# Patient Record
Sex: Female | Born: 1975 | Race: Black or African American | Hispanic: No | Marital: Single | Smoking: Former smoker
Health system: Southern US, Community
[De-identification: ages and names within clinical notes are randomized; demographics above are authoritative.]

## PROBLEM LIST (undated history)

## (undated) DIAGNOSIS — K047 Periapical abscess without sinus: Secondary | ICD-10-CM

## (undated) DIAGNOSIS — Z973 Presence of spectacles and contact lenses: Secondary | ICD-10-CM

## (undated) DIAGNOSIS — D171 Benign lipomatous neoplasm of skin and subcutaneous tissue of trunk: Secondary | ICD-10-CM

## (undated) DIAGNOSIS — D649 Anemia, unspecified: Secondary | ICD-10-CM

---

## 2011-12-22 ENCOUNTER — Emergency Department (HOSPITAL_COMMUNITY)
Admission: EM | Admit: 2011-12-22 | Discharge: 2011-12-22 | Disposition: A | Discharge status: Home / Self Care | Payer: Self-pay | Attending: Emergency Medicine | Admitting: Emergency Medicine

## 2011-12-22 ENCOUNTER — Emergency Department (HOSPITAL_COMMUNITY): Payer: Self-pay

## 2011-12-22 ENCOUNTER — Encounter (HOSPITAL_COMMUNITY): Payer: Self-pay | Admitting: *Deleted

## 2011-12-22 DIAGNOSIS — D179 Benign lipomatous neoplasm, unspecified: Secondary | ICD-10-CM | POA: Insufficient documentation

## 2011-12-22 NOTE — ED Provider Notes (Addendum)
History   Scribed for [employer]. Colon Branch, MD, the patient was seen in APA08/AP[facility]08. The chart was scribed by Gilman Schmidt. The patients care was started at 4:15 PM.   CSN: [id number]  Arrival date & time 12/22/11  1552   First MD Initiated Contact with Patient 12/22/11 1612      Chief Complaint  Patient presents with  . lump to left upper chest     (Consider location/radiation/quality/duration/timing/severity/associated sxs/prior treatment) HPI Shelly Ramirez is a 36 y.o. female who presents to the Emergency Department complaining of lump to left upper chest onset ~six months. Denies any pain. There are no other associated symptoms and no other alleviating or aggravating factors.   PCP: none    History reviewed. No pertinent past medical history.  Past Surgical History  Procedure Date  . Cesarean section     No family history on file.  History  Substance Use Topics  . Smoking status: Never Smoker   . Smokeless tobacco: Not on file  . Alcohol Use: Yes     Occ    OB History    Grav Para Term Preterm Abortions TAB SAB Ect Mult Living                  Review of Systems  Skin:       Lump to left upper chest   All other systems reviewed and are negative.    Allergies  Review of patient's allergies indicates no known allergies.  Home Medications  No current outpatient prescriptions on file.  BP 131/83  Pulse 82  Temp(Src) 97.9 F (36.6 C) (Oral)  Resp 20  Ht 5\' 9"  (1.753 m)  Wt 292 lb 5 oz (132.592 kg)  BMI 43.17 kg/m2  SpO2 100%  LMP 12/11/2011  Physical Exam  Nursing note and vitals reviewed. Constitutional:       Awake, alert, nontoxic appearance.  HENT:  Head: Atraumatic.  Eyes: Right eye exhibits no discharge. Left eye exhibits no discharge.  Neck: Neck supple.  Pulmonary/Chest: Effort normal. She exhibits no tenderness.       Discrete 2x5 cm soft tissue mass to left upper chest  Freely moveable  Non tender No warmth No legions  Abdominal:  Soft. There is no tenderness. There is no rebound.  Musculoskeletal: She exhibits no tenderness.       Baseline ROM, no obvious new focal weakness.  Neurological:       Mental status and motor strength appears baseline for patient and situation.  Skin: No rash noted.  Psychiatric: She has a normal mood and affect.    ED Course  Procedures (including critical care time)  Labs Reviewed - No data to display No results found.   No diagnosis found.  DIAGNOSTIC STUDIES: Oxygen Saturation is 100% on room air, normal by my interpretation.    COORDINATION OF CARE: 4:15p:  - Patient evaluated by ED physician, DG Chest ordered      MDM  Patient presents with a soft tissue mass to the left upper chest. It has been present for some time. Chest x-ray cannot appreciate the area. Further evaluation could be with either CT or MRI, neither of which are indicated at the present time. Dx testing d/w pt. Questions answered.  Verb understanding, agreeable to d/c home with outpt f/u.Pt stable in ED with no significant deterioration in condition.The patient appears reasonably screened and/or stabilized for discharge and I doubt any other medical condition or other [facility] requiring further screening, evaluation, or  treatment in the ED at this time prior to discharge.  I personally performed the services described in this documentation, which was scribed in my presence. The recorded information has been reviewed and considered.   MDM Reviewed: nursing note and vitals Interpretation: x-ray           Nicoletta Dress. Colon Branch, MD 12/22/11 1708  Nicoletta Dress. Colon Branch, MD 12/22/11 1712

## 2011-12-22 NOTE — Discharge Instructions (Signed)
This area is a bit of fatty tissue. You may continue surveillance. If it gets larger, painful, has any drainage, you should have it evaluated. Lipoma A lipoma is a noncancerous (benign) tumor composed of fat cells. They are usually found under the skin (subcutaneous). A lipoma may occur in any tissue of the body that contains fat. Common areas for lipomas to appear include the back, shoulders, buttocks, and thighs. Lipomas are a very common soft tissue growth. They are soft and grow slowly. Most problems caused by a lipoma depend on where it is growing. DIAGNOSIS  A lipoma can be diagnosed with a physical exam. These tumors rarely become cancerous, but radiographic studies can help determine this for certain. Studies used may include:  Computerized X-ray scans (CT or CAT scan).   Computerized magnetic scans (MRI).  TREATMENT  Small lipomas that are not causing problems may be watched. If a lipoma continues to enlarge or causes problems, removal is often the best treatment. Lipomas can also be removed to improve appearance. Surgery is done to remove the fatty cells and the surrounding capsule. Most often, this is done with medicine that numbs the area (local anesthetic). The removed tissue is examined under a microscope to make sure it is not cancerous. Keep all follow-up appointments with your caregiver. SEEK MEDICAL CARE IF:   The lipoma becomes larger or hard.   The lipoma becomes painful, red, or increasingly swollen. These could be signs of infection or a more serious condition.  Document Released: 08/02/2002 Document Revised: 08/01/2011 Document Reviewed: 01/12/2010 [facility] Patient Information 2012 [place], [place].

## 2011-12-22 NOTE — ED Notes (Signed)
Large, soft area of swelling to left upper chest. First noticed 6 mo ago but has gotten larger.

## 2014-07-11 ENCOUNTER — Emergency Department (HOSPITAL_COMMUNITY)
Admission: EM | Admit: 2014-07-11 | Discharge: 2014-07-12 | Disposition: A | Discharge status: Home / Self Care | Payer: Self-pay | Attending: Emergency Medicine | Admitting: Emergency Medicine

## 2014-07-11 ENCOUNTER — Encounter (HOSPITAL_COMMUNITY): Payer: Self-pay | Admitting: Emergency Medicine

## 2014-07-11 DIAGNOSIS — Z792 Long term (current) use of antibiotics: Secondary | ICD-10-CM | POA: Insufficient documentation

## 2014-07-11 DIAGNOSIS — K047 Periapical abscess without sinus: Secondary | ICD-10-CM | POA: Insufficient documentation

## 2014-07-11 LAB — BASIC METABOLIC PANEL
ANION GAP: 9 (ref 5–15)
BUN: 9 mg/dL (ref 6–23)
CALCIUM: 9.2 mg/dL (ref 8.4–10.5)
CO2: 28 mEq/L (ref 19–32)
Chloride: 100 mEq/L (ref 96–112)
Creatinine, Ser: 0.98 mg/dL (ref 0.50–1.10)
GFR, EST AFRICAN AMERICAN: 85 mL/min — AB (ref >90)
GFR, EST NON AFRICAN AMERICAN: 73 mL/min — AB (ref >90)
Glucose, Bld: 95 mg/dL (ref 70–99)
Potassium: 3.7 mEq/L (ref 3.7–5.3)
SODIUM: 137 meq/L (ref 137–147)

## 2014-07-11 LAB — CBC WITH DIFFERENTIAL/PLATELET
Basophils Absolute: 0 10*3/uL (ref 0.0–0.1)
Basophils Relative: 0 % (ref 0–1)
EOS ABS: 0.1 10*3/uL (ref 0.0–0.7)
Eosinophils Relative: 1 % (ref 0–5)
HCT: 30.6 % — ABNORMAL LOW (ref 36.0–46.0)
Hemoglobin: 9.6 g/dL — ABNORMAL LOW (ref 12.0–15.0)
Lymphocytes Relative: 29 % (ref 12–46)
Lymphs Abs: 3.3 10*3/uL (ref 0.7–4.0)
MCH: 21.6 pg — AB (ref 26.0–34.0)
MCHC: 31.4 g/dL (ref 30.0–36.0)
MCV: 68.8 fL — ABNORMAL LOW (ref 78.0–100.0)
MONO ABS: 0.9 10*3/uL (ref 0.1–1.0)
Monocytes Relative: 8 % (ref 3–12)
NEUTROS PCT: 62 % (ref 43–77)
Neutro Abs: 7.2 10*3/uL (ref 1.7–7.7)
PLATELETS: 479 10*3/uL — AB (ref 150–400)
RBC: 4.45 MIL/uL (ref 3.87–5.11)
RDW: 19.4 % — ABNORMAL HIGH (ref 11.5–15.5)
WBC: 11.5 10*3/uL — ABNORMAL HIGH (ref 4.0–10.5)

## 2014-07-11 MED ORDER — HYDROMORPHONE HCL 1 MG/ML IJ SOLN
0.5000 mg | Freq: Once | INTRAMUSCULAR | Status: AC
  Administered 2014-07-11: 0.5 mg via INTRAVENOUS
  Filled 2014-07-11: qty 1

## 2014-07-11 MED ORDER — SODIUM CHLORIDE 0.9 % IV SOLN
3.0000 g | Freq: Once | INTRAVENOUS | Status: AC
  Administered 2014-07-11: 3 g via INTRAVENOUS
  Filled 2014-07-11: qty 3

## 2014-07-11 MED ORDER — SODIUM CHLORIDE 0.9 % IV BOLUS (SEPSIS)
500.0000 mL | Freq: Once | INTRAVENOUS | Status: AC
  Administered 2014-07-11: 500 mL via INTRAVENOUS

## 2014-07-11 MED ORDER — AMOXICILLIN-POT CLAVULANATE 875-125 MG PO TABS: 1.0000 | ORAL_TABLET | Freq: Two times a day (BID) | ORAL | Status: DC

## 2014-07-11 MED ORDER — OXYCODONE-ACETAMINOPHEN 5-325 MG PO TABS
1.0000 | ORAL_TABLET | Freq: Once | ORAL | Status: AC
  Administered 2014-07-12: 1 via ORAL
  Filled 2014-07-11: qty 1

## 2014-07-11 NOTE — ED Provider Notes (Signed)
CSN: [id number]     Arrival date & time 07/11/14  1644 History   This chart was scribed for [employer]. Alvino Chapel, MD by Tula Nakayama, ED Scribe. This patient was seen in room APA19/APA19 and the patient's care was started at 8:54 PM.   Chief Complaint  Patient presents with  . Dental Pain   The history is provided by the patient. No language interpreter was used.    HPI Comments: Shelly Ramirez is a 38 y.o. female who presents to the Emergency Department complaining of constant, gradually worsening jaw pain and swelling that radiates to her ear and started 3 days ago. Pt states she has 2 bad molars on left side, one upper and one lower. She went to [facility] 2 days ago and was prescribed Penicillin and Hydrocodone for pain. She has been on treatment for 36 hours with no relief. Pt states symptoms became worse yesterday and today. She denies history of similar symptoms and the possibility of pregnancy. Pt denies fevers, SOB, and drainage from teeth.   History reviewed. No pertinent past medical history. Past Surgical History  Procedure Laterality Date  . Cesarean section     No family history on file. History  Substance Use Topics  . Smoking status: Never Smoker   . Smokeless tobacco: Not on file  . Alcohol Use: Yes     Comment: Occ   OB History    No data available     Review of Systems  Constitutional: Negative for fever.  HENT: Positive for dental problem and facial swelling.   Respiratory: Negative for shortness of breath.   All other systems reviewed and are negative.     Allergies  Review of patient's allergies indicates no known allergies.  Home Medications   Prior to Admission medications   Medication Sig Start Date End Date Taking? Authorizing Provider  amoxicillin (AMOXIL) 500 MG capsule Take 500 mg by mouth 3 (three) times daily. 07/08/14  Yes Historical Provider, MD  amoxicillin-clavulanate (AUGMENTIN) 875-125 MG per tablet Take 1 tablet by mouth 2 (two) times  daily. 07/11/14   Jasper Riling. Giuseppina Quinones, MD  HYDROcodone-acetaminophen (NORCO/VICODIN) 5-325 MG per tablet Take 1 tablet by mouth every 4 (four) hours as needed. For pain 07/08/14   Historical Provider, MD  oxyCODONE-acetaminophen (PERCOCET/ROXICET) 5-325 MG per tablet Take 1-2 tablets by mouth every 6 (six) hours as needed for severe pain. 07/12/14   Jasper Riling. Tamieka Rancourt, MD   BP 138/94 mmHg  Pulse 65  Temp(Src) 98.8 F (37.1 C)  Resp 20  Ht 5' 11.5" (1.816 m)  Wt 285 lb (129.275 kg)  BMI 39.20 kg/m2  SpO2 99%  LMP 06/20/2014 Physical Exam  Constitutional: She appears well-developed and well-nourished.  Appears uncomfortable  HENT:  Head: Normocephalic and atraumatic.  Left-sided facial swelling from ear to the cheek and some on the neck. Left upper, most posterior tooth broken off with pain and swelling of superior gum. Tender on bottom, most posterior tooth. No swelling or fluctuance.   Eyes: Conjunctivae and EOM are normal.  Neck: Neck supple. No tracheal deviation present.  Cardiovascular:  Tachycardia  Pulmonary/Chest: Effort normal. No respiratory distress.  Lungs clear bilaterally  Skin: Skin is warm and dry.  Psychiatric: She has a normal mood and affect. Her behavior is normal.  Nursing note and vitals reviewed.   ED Course  Procedures (including critical care time)  COORDINATION OF CARE: 8:58 PM Discussed treatment plan which includes lab work. Pt agreed to plan.  Results for  orders placed or performed during the hospital encounter of 07/11/14  CBC with Differential  Result Value Ref Range   WBC 11.5 (H) 4.0 - 10.5 K/uL   RBC 4.45 3.87 - 5.11 MIL/uL   Hemoglobin 9.6 (L) 12.0 - 15.0 g/dL   HCT 30.6 (L) 36.0 - 46.0 %   MCV 68.8 (L) 78.0 - 100.0 fL   MCH 21.6 (L) 26.0 - 34.0 pg   MCHC 31.4 30.0 - 36.0 g/dL   RDW 19.4 (H) 11.5 - 15.5 %   Platelets 479 (H) 150 - 400 K/uL   Neutrophils Relative % 62 43 - 77 %   Lymphocytes Relative 29 12 - 46 %   Monocytes  Relative 8 3 - 12 %   Eosinophils Relative 1 0 - 5 %   Basophils Relative 0 0 - 1 %   Neutro Abs 7.2 1.7 - 7.7 K/uL   Lymphs Abs 3.3 0.7 - 4.0 K/uL   Monocytes Absolute 0.9 0.1 - 1.0 K/uL   Eosinophils Absolute 0.1 0.0 - 0.7 K/uL   Basophils Absolute 0.0 0.0 - 0.1 K/uL   RBC Morphology STOMATOCYTES   Basic metabolic panel  Result Value Ref Range   Sodium 137 137 - 147 mEq/L   Potassium 3.7 3.7 - 5.3 mEq/L   Chloride 100 96 - 112 mEq/L   CO2 28 19 - 32 mEq/L   Glucose, Bld 95 70 - 99 mg/dL   BUN 9 6 - 23 mg/dL   Creatinine, Ser 0.98 0.50 - 1.10 mg/dL   Calcium 9.2 8.4 - 10.5 mg/dL   GFR calc non Af Amer 73 (L) >90 mL/min   GFR calc Af Amer 85 (L) >90 mL/min   Anion gap 9 5 - 15   No results found.  Labs Review Labs Reviewed  CBC WITH DIFFERENTIAL - Abnormal; Notable for the following:    WBC 11.5 (*)    Hemoglobin 9.6 (*)    HCT 30.6 (*)    MCV 68.8 (*)    MCH 21.6 (*)    RDW 19.4 (*)    Platelets 479 (*)    All other components within normal limits  BASIC METABOLIC PANEL - Abnormal; Notable for the following:    GFR calc non Af Amer 73 (*)    GFR calc Af Amer 85 (*)    All other components within normal limits    Imaging Review No results found.   EKG Interpretation None      MDM   Final diagnoses:  Dental abscess    Patient with likely dental abscess. Has been on antibiotics. Only had a day and a half of antibiotics. Increasing pain. Some mild trismus but doubt airway involvement or superficially drainable abscess. Some swelling but not able to see area to drain. Has been given dose of IV Unasyn and will switch to Augmentin. Will follow-up with a dentist/OMFS   I personally performed the services described in this documentation, which was scribed in my presence. The recorded information has been reviewed and is accurate.      Jasper Riling. Alvino Chapel, MD 07/12/14 [phone]

## 2014-07-11 NOTE — ED Notes (Signed)
Patient c/o dental pain and mouth swelling from an abscessed tooth, per patient. Patient states she was seen at [facility] on Friday for the same. Patient states pain has gotten worse. F&VW8

## 2014-07-11 NOTE — Discharge Instructions (Signed)

## 2014-07-11 NOTE — ED Notes (Signed)
Jaw pain, swollen gum and face on left side, x 2 days, pt has broken tooth on that side, pt has been unable to eat.

## 2014-07-12 MED ORDER — OXYCODONE-ACETAMINOPHEN 5-325 MG PO TABS: 1.0000 | ORAL_TABLET | Freq: Four times a day (QID) | ORAL | Status: DC | PRN

## 2014-07-12 NOTE — ED Notes (Signed)
Patient verbalizes understanding of discharge instructions, prescription medications, home care, and follow up care. Patient ambulatory out of department at this time escorted by family.

## 2015-03-20 ENCOUNTER — Emergency Department (HOSPITAL_COMMUNITY)
Admission: EM | Admit: 2015-03-20 | Discharge: 2015-03-20 | Disposition: A | Discharge status: Home / Self Care | Payer: Self-pay | Attending: Emergency Medicine | Admitting: Emergency Medicine

## 2015-03-20 ENCOUNTER — Emergency Department (HOSPITAL_COMMUNITY): Payer: Self-pay

## 2015-03-20 ENCOUNTER — Encounter (HOSPITAL_COMMUNITY): Payer: Self-pay | Admitting: *Deleted

## 2015-03-20 DIAGNOSIS — Z792 Long term (current) use of antibiotics: Secondary | ICD-10-CM | POA: Insufficient documentation

## 2015-03-20 DIAGNOSIS — S62032K Displaced fracture of proximal third of navicular [scaphoid] bone of left wrist, subsequent encounter for fracture with nonunion: Secondary | ICD-10-CM | POA: Insufficient documentation

## 2015-03-20 DIAGNOSIS — W1839XD Other fall on same level, subsequent encounter: Secondary | ICD-10-CM | POA: Insufficient documentation

## 2015-03-20 MED ORDER — OXYCODONE-ACETAMINOPHEN 5-325 MG PO TABS: 2.0000 | ORAL_TABLET | ORAL | Status: DC | PRN

## 2015-03-20 MED ORDER — LIDOCAINE HCL (PF) 1 % IJ SOLN
10.0000 mL | Freq: Once | INTRAMUSCULAR | Status: DC
  Filled 2015-03-20: qty 10

## 2015-03-20 MED ORDER — OXYCODONE-ACETAMINOPHEN 5-325 MG PO TABS
2.0000 | ORAL_TABLET | Freq: Once | ORAL | Status: AC
  Administered 2015-03-20: 2 via ORAL
  Filled 2015-03-20: qty 2

## 2015-03-20 NOTE — ED Notes (Signed)
Assisted Dr Luna Glasgow with wrist reduction.

## 2015-03-20 NOTE — ED Notes (Signed)
Pt seen at [facility] Saturday morning after a fall of same morning, pt states that xray showed a fx to wrist, splint and arm sling in place to left arm, pt c/o pain and fingers tingling, fingers warm to touch and good capillary refill

## 2015-03-20 NOTE — ED Provider Notes (Signed)
CSN: [id number]     Arrival date & time 03/20/15  1225 History   First MD Initiated Contact with Patient 03/20/15 1321     Chief Complaint  Patient presents with  . Wrist Pain     (Consider location/radiation/quality/duration/timing/severity/associated sxs/prior Treatment) Patient is a 39 y.o. female presenting with wrist pain. The history is provided by the patient. No language interpreter was used.  Wrist Pain This is a new problem. The current episode started in the past 7 days. The problem occurs constantly. The problem has been rapidly worsening. Associated symptoms include joint swelling and myalgias. Nothing aggravates the symptoms. She has tried nothing for the symptoms. The treatment provided moderate relief.  Pt was seen at [place] on Saturday am.   Pt diagnosed with a distal radius and ulna fracture.   History reviewed. No pertinent past medical history. Past Surgical History  Procedure Laterality Date  . Cesarean section     History reviewed. No pertinent family history. History  Substance Use Topics  . Smoking status: Never Smoker   . Smokeless tobacco: Not on file  . Alcohol Use: Yes     Comment: Occ   OB History    No data available     Review of Systems  Musculoskeletal: Positive for myalgias and joint swelling.  All other systems reviewed and are negative.     Allergies  Review of patient's allergies indicates no known allergies.  Home Medications   Prior to Admission medications   Medication Sig Start Date End Date Taking? Authorizing Provider  amoxicillin (AMOXIL) 500 MG capsule Take 500 mg by mouth 3 (three) times daily. 07/08/14   Historical Provider, MD  amoxicillin-clavulanate (AUGMENTIN) 875-125 MG per tablet Take 1 tablet by mouth 2 (two) times daily. 07/11/14   Davonna Belling, MD  HYDROcodone-acetaminophen (NORCO/VICODIN) 5-325 MG per tablet Take 1 tablet by mouth every 4 (four) hours as needed. For pain 07/08/14   Historical Provider, MD   oxyCODONE-acetaminophen (PERCOCET/ROXICET) 5-325 MG per tablet Take 2 tablets by mouth every 4 (four) hours as needed for severe pain. 03/20/15   Fransico Meadow, PA-C   BP 131/83 mmHg  Pulse 94  Temp(Src) 98.7 F (37.1 C) (Oral)  Resp 16  Ht 5\' 11"  (1.803 m)  Wt 290 lb (131.543 kg)  BMI 40.46 kg/m2  SpO2 99%  LMP 03/09/2015 Physical Exam  Constitutional: She is oriented to person, place, and time. She appears well-developed and well-nourished.  HENT:  Head: Normocephalic.  Eyes: Pupils are equal, round, and reactive to light.  Cardiovascular: Normal rate.   Pulmonary/Chest: Effort normal.  Musculoskeletal: She exhibits tenderness.  Distal wrist swelling. fingrs swollen.  nv and ns intact  Neurological: She is oriented to person, place, and time.  Skin: Skin is warm.  Psychiatric: She has a normal mood and affect.  Nursing note and vitals reviewed.   ED Course  Procedures (including critical care time) Labs Review Labs Reviewed - No data to display  Imaging Review Dg Wrist Complete Left  03/20/2015   CLINICAL DATA:  [place] backwards off a porch. Railing gave way. This occurred 2 days ago.  EXAM: LEFT WRIST - COMPLETE 3+ VIEW  COMPARISON:  Radiographs from [facility] 03/18/2015.  FINDINGS: Dorsally displaced and comminuted distal radius fracture. The distal radius fragments are completely dorsal to the shaft of the radius. There is associated moderate deformity. Carpal bones maintain normal articulation with the distal radius fragment. No visible carpal bone fracture. There is an ulnar styloid  avulsion. Soft tissue swelling. Similar appearance compared to the films 2 days ago.  IMPRESSION: Severely displaced and comminuted distal radius fracture. Ulnar styloid avulsion. Soft tissue swelling.   Electronically Signed   By: Staci Righter M.D.   On: 03/20/2015 14:10     EKG Interpretation None      MDM  [place] referred to Dr. Laurance Flatten.  In [place].  (Pt request Orthopaedist  in [place])   I spoke to him  He reports pt called office and was belligerent with staff.  He will still see pt however he is in [place] tomorrow and pt can not get transportation.   I spoke with Jeani Hawking at Dr. Bertis Ruddy office, per Dr. Burney Gauze he request we attempt reduction.    Dr. Luna Glasgow agrees to do reduction. He saw pt and was able to reduce.   Pt is advised she will still need to see Dr. Burney Gauze.  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

## 2015-03-20 NOTE — ED Notes (Signed)
X-ray at bedside

## 2015-03-20 NOTE — Discharge Instructions (Signed)
Wrist Fracture A wrist fracture is a break or crack in one of the bones of your wrist. Your wrist is made up of eight small bones at the palm of your hand (carpal bones) and two long bones that make up your forearm (radius and ulna).  CAUSES   A direct blow to the wrist.  Falling on an outstretched hand.  Trauma, such as a car accident or a fall. RISK FACTORS Risk factors for wrist fracture include:   Participating in contact and high-risk sports, such as skiing, biking, and ice skating.  Taking steroid medicines.  Smoking.  Being female.  Being Caucasian.  Drinking more than three alcoholic beverages per day.  Having low or lowered bone density (osteoporosis or osteopenia).  Age. Older adults have decreased bone density.  Women who have had menopause.  History of previous fractures. SIGNS AND SYMPTOMS Symptoms of wrist fractures include tenderness, bruising, and inflammation. Additionally, the wrist may hang in an odd position or appear deformed.  DIAGNOSIS Diagnosis may include:  Physical exam.  X-ray. TREATMENT Treatment depends on many factors, including the nature and location of the fracture, your age, and your activity level. Treatment for wrist fracture can be nonsurgical or surgical.  Nonsurgical Treatment A plaster cast or splint may be applied to your wrist if the bone is in a good position. If the fracture is not in good position, it may be necessary for your health care provider to realign it before applying a splint or cast. Usually, a cast or splint will be worn for several weeks.  Surgical Treatment Sometimes the position of the bone is so far out of place that surgery is required to apply a device to hold it together as it heals. Depending on the fracture, there are a number of options for holding the bone in place while it heals, such as a cast and metal pins.  HOME CARE INSTRUCTIONS  Keep your injured wrist elevated and move your fingers as much as  possible.  Do not put pressure on any part of your cast or splint. It may break.   Use a plastic bag to protect your cast or splint from water while bathing or showering. Do not lower your cast or splint into water.  Take medicines only as directed by your health care provider.  Keep your cast or splint clean and dry. If it becomes wet, damaged, or suddenly feels too tight, contact your health care provider right away.  Do not use any tobacco products including cigarettes, chewing tobacco, or electronic cigarettes. Tobacco can delay bone healing. If you need help quitting, ask your health care provider.  Keep all follow-up visits as directed by your health care provider. This is important.  Ask your health care provider if you should take supplements of calcium and vitamins C and D to promote bone healing. SEEK MEDICAL CARE IF:   Your cast or splint is damaged, breaks, or gets wet.  You have a fever.  You have chills.  You have continued severe pain or more swelling than you did before the cast was put on. SEEK IMMEDIATE MEDICAL CARE IF:   Your hand or fingernails on the injured arm turn blue or gray, or feel cold or numb.  You have decreased feeling in the fingers of your injured arm. MAKE SURE YOU:  Understand these instructions.  Will watch your condition.  Will get help right away if you are not doing well or get worse. Document Released: 05/22/2005 Document Revised:   12/27/2013 Document Reviewed: 08/30/2011 [employer] Patient Information 2015 [employer]. This information is not intended to replace advice given to you by your health care provider. Make sure you discuss any questions you have with your health care provider.  

## 2015-03-20 NOTE — Consult Note (Signed)
Reason for Consult:Displaced fracture of the left wrist Referring Physician: ER  Shelly Ramirez is an 39 y.o. female.  HPI: She was visiting someone and the railing on a stairway came out of the wall and she fell and hurt her left wrist on Saturday.  She had no other injuries.  She had no head injury.  She was seen at [facility] in [place].  They had no orthopaedic coverage.  They placed her in a splint as made referral to [facility] today.  She was unable to get to [facility] for various reasons.  She came to this ER.  She complains of Ramirez and numbness of the fingers and Ramirez in the left wrist.  The Hand Service was called but they asked that I try a closed reduction first.  The Hand Service, Dr. Burney Gauze, will see her tomorrow.  I was called and came to the ER.  I told the patient since this has been "out of place" for several days and because of the swelling not to expect an anatomic reduction.  I think I can improve it somewhat but she will still need to see Hand Service.  She appeared to understand and agreed.  I told her I would put her in a sugar tong splint after attempted reduction.  She has already received Percocet orally prior.  I planned to do a hematoma block.  History reviewed. No pertinent past medical history.  Past Surgical History  Procedure Laterality Date  . Cesarean section      History reviewed. No pertinent family history.  Social History:  reports that she has never smoked. She does not have any smokeless tobacco history on file. She reports that she drinks alcohol. She reports that she does not use illicit drugs.  Allergies: No Known Allergies  Medications: I have reviewed the patient's current medications.  No results found for this or any previous visit (from the past 48 hour(s)).  Dg Wrist Complete Left  03/20/2015   CLINICAL DATA:  [address] backwards off a porch. Railing gave way. This occurred 2 days ago.  EXAM: LEFT WRIST - COMPLETE 3+ VIEW   COMPARISON:  Radiographs from [facility] 03/18/2015.  FINDINGS: Dorsally displaced and comminuted distal radius fracture. The distal radius fragments are completely dorsal to the shaft of the radius. There is associated moderate deformity. Carpal bones maintain normal articulation with the distal radius fragment. No visible carpal bone fracture. There is an ulnar styloid avulsion. Soft tissue swelling. Similar appearance compared to the films 2 days ago.  IMPRESSION: Severely displaced and comminuted distal radius fracture. Ulnar styloid avulsion. Soft tissue swelling.   Electronically Signed   By: Staci Righter M.D.   On: 03/20/2015 14:10    Review of Systems  Musculoskeletal: Positive for joint Ramirez (left wrist Ramirez from fall Saturday.) and falls [address] Saturday hurt left wrist.  ).   Blood pressure 131/83, pulse 94, temperature 98.7 F (37.1 C), temperature source Oral, resp. rate 16, height 5\' 11"  (1.803 m), weight 131.543 kg (290 lb), last menstrual period 03/09/2015, SpO2 99 %. Physical Exam  Constitutional: She is oriented to person, place, and time. She appears well-developed and well-nourished.  HENT:  Head: Normocephalic and atraumatic.  Eyes: Conjunctivae and EOM are normal. Pupils are equal, round, and reactive to light.  Neck: Normal range of motion.  Cardiovascular: Normal rate, regular rhythm and intact distal pulses.   Respiratory: Effort normal.  GI: Soft.  Musculoskeletal: She exhibits tenderness (Ramirez, swelling and  complains of numbness of the fingers of the left hand.  Deformity of the left wrist with limited to no  motion.  Emotional.).       Arms: Neurological: She is alert and oriented to person, place, and time. She has normal reflexes.  Skin: Skin is warm and dry.  Psychiatric: She has a normal mood and affect. Her behavior is normal. Judgment and thought content normal.  Crying, in Ramirez.    Assessment/Plan: Displaced distal radius fracture since Saturday.   For attempted closed reduction.  She will need to see Hand Service tomorrow.  Under sterile technique, the left dorsal wrist was prepped.  1% Xylocaine, 10 cc, was placed as a hematoma block.  Once anesthesia was obtained in the left wrist area dorsally, I did a closed reduction and applied a sugar tong splint.  I await the x-rays.  She has been told already that the reduction will most likely not be anatomical and she will need to see the Hand Service but it should improve her situation.     Shelly Ramirez 03/20/2015, 3:40 PM

## 2015-03-21 ENCOUNTER — Encounter (HOSPITAL_BASED_OUTPATIENT_CLINIC_OR_DEPARTMENT_OTHER): Payer: Self-pay | Admitting: *Deleted

## 2015-03-21 ENCOUNTER — Other Ambulatory Visit: Payer: Self-pay | Admitting: Orthopedic Surgery

## 2015-03-22 ENCOUNTER — Encounter (HOSPITAL_BASED_OUTPATIENT_CLINIC_OR_DEPARTMENT_OTHER): Payer: Self-pay

## 2015-03-22 ENCOUNTER — Ambulatory Visit (HOSPITAL_BASED_OUTPATIENT_CLINIC_OR_DEPARTMENT_OTHER)
Admission: RE | Admit: 2015-03-22 | Discharge: 2015-03-22 | Disposition: A | Discharge status: Home / Self Care | Payer: Self-pay | Source: Ambulatory Visit | Attending: Orthopedic Surgery | Admitting: Orthopedic Surgery

## 2015-03-22 ENCOUNTER — Encounter (HOSPITAL_BASED_OUTPATIENT_CLINIC_OR_DEPARTMENT_OTHER)
Admission: RE | Disposition: A | Discharge status: Home / Self Care | Payer: Self-pay | Source: Ambulatory Visit | Attending: Orthopedic Surgery

## 2015-03-22 ENCOUNTER — Ambulatory Visit (HOSPITAL_BASED_OUTPATIENT_CLINIC_OR_DEPARTMENT_OTHER): Payer: Self-pay | Admitting: Anesthesiology

## 2015-03-22 DIAGNOSIS — S52572A Other intraarticular fracture of lower end of left radius, initial encounter for closed fracture: Secondary | ICD-10-CM | POA: Insufficient documentation

## 2015-03-22 DIAGNOSIS — W1789XA Other fall from one level to another, initial encounter: Secondary | ICD-10-CM | POA: Insufficient documentation

## 2015-03-22 DIAGNOSIS — Z6841 Body Mass Index (BMI) 40.0 and over, adult: Secondary | ICD-10-CM | POA: Insufficient documentation

## 2015-03-22 DIAGNOSIS — G5602 Carpal tunnel syndrome, left upper limb: Secondary | ICD-10-CM | POA: Insufficient documentation

## 2015-03-22 DIAGNOSIS — S52612A Displaced fracture of left ulna styloid process, initial encounter for closed fracture: Secondary | ICD-10-CM | POA: Insufficient documentation

## 2015-03-22 HISTORY — PX: CARPAL TUNNEL RELEASE: SHX101

## 2015-03-22 HISTORY — PX: OPEN REDUCTION INTERNAL FIXATION (ORIF) DISTAL RADIAL FRACTURE: SHX5989

## 2015-03-22 LAB — POCT HEMOGLOBIN-HEMACUE: Hemoglobin: 10.2 g/dL — ABNORMAL LOW (ref 12.0–15.0)

## 2015-03-22 SURGERY — OPEN REDUCTION INTERNAL FIXATION (ORIF) DISTAL RADIUS FRACTURE
Anesthesia: Regional | Site: Wrist | Laterality: Left

## 2015-03-22 MED ORDER — CHLORHEXIDINE GLUCONATE 4 % EX LIQD: 60.0000 mL | Freq: Once | CUTANEOUS | Status: DC

## 2015-03-22 MED ORDER — SCOPOLAMINE 1 MG/3DAYS TD PT72: 1.0000 | MEDICATED_PATCH | Freq: Once | TRANSDERMAL | Status: DC | PRN

## 2015-03-22 MED ORDER — CEFAZOLIN SODIUM-DEXTROSE 2-3 GM-% IV SOLR: 2.0000 g | INTRAVENOUS | Status: DC

## 2015-03-22 MED ORDER — FENTANYL CITRATE (PF) 100 MCG/2ML IJ SOLN
50.0000 ug | INTRAMUSCULAR | Status: DC | PRN
  Administered 2015-03-22: 100 ug via INTRAVENOUS

## 2015-03-22 MED ORDER — PROPOFOL 10 MG/ML IV BOLUS
INTRAVENOUS | Status: DC | PRN
  Administered 2015-03-22: 300 mg via INTRAVENOUS
  Administered 2015-03-22: 100 mg via INTRAVENOUS

## 2015-03-22 MED ORDER — GLYCOPYRROLATE 0.2 MG/ML IJ SOLN: 0.2000 mg | Freq: Once | INTRAMUSCULAR | Status: DC | PRN

## 2015-03-22 MED ORDER — CEFAZOLIN SODIUM 1-5 GM-% IV SOLN
INTRAVENOUS | Status: AC
  Filled 2015-03-22: qty 50

## 2015-03-22 MED ORDER — OXYCODONE-ACETAMINOPHEN 5-325 MG PO TABS: 1.0000 | ORAL_TABLET | ORAL | Status: DC | PRN

## 2015-03-22 MED ORDER — DEXAMETHASONE SODIUM PHOSPHATE 10 MG/ML IJ SOLN
INTRAMUSCULAR | Status: DC | PRN
  Administered 2015-03-22: 10 mg via INTRAVENOUS

## 2015-03-22 MED ORDER — MEPERIDINE HCL 25 MG/ML IJ SOLN: 6.2500 mg | INTRAMUSCULAR | Status: DC | PRN

## 2015-03-22 MED ORDER — LIDOCAINE HCL 2 % IJ SOLN
INTRAMUSCULAR | Status: AC
  Filled 2015-03-22: qty 20

## 2015-03-22 MED ORDER — FENTANYL CITRATE (PF) 100 MCG/2ML IJ SOLN
INTRAMUSCULAR | Status: AC
  Filled 2015-03-22: qty 6

## 2015-03-22 MED ORDER — ONDANSETRON HCL 4 MG/2ML IJ SOLN
INTRAMUSCULAR | Status: DC | PRN
  Administered 2015-03-22: 4 mg via INTRAVENOUS

## 2015-03-22 MED ORDER — DEXTROSE 5 % IV SOLN
3.0000 g | INTRAVENOUS | Status: AC
  Administered 2015-03-22: 3 g via INTRAVENOUS

## 2015-03-22 MED ORDER — BUPIVACAINE-EPINEPHRINE (PF) 0.5% -1:200000 IJ SOLN
INTRAMUSCULAR | Status: DC | PRN
  Administered 2015-03-22: 25 mL via PERINEURAL

## 2015-03-22 MED ORDER — FENTANYL CITRATE (PF) 100 MCG/2ML IJ SOLN
INTRAMUSCULAR | Status: AC
  Filled 2015-03-22: qty 2

## 2015-03-22 MED ORDER — CEFAZOLIN SODIUM-DEXTROSE 2-3 GM-% IV SOLR
INTRAVENOUS | Status: AC
  Filled 2015-03-22: qty 50

## 2015-03-22 MED ORDER — OXYCODONE HCL 5 MG PO TABS: 5.0000 mg | ORAL_TABLET | Freq: Once | ORAL | Status: DC | PRN

## 2015-03-22 MED ORDER — LACTATED RINGERS IV SOLN
INTRAVENOUS | Status: DC
  Administered 2015-03-22 (×2): via INTRAVENOUS

## 2015-03-22 MED ORDER — MIDAZOLAM HCL 2 MG/2ML IJ SOLN
INTRAMUSCULAR | Status: AC
  Filled 2015-03-22: qty 2

## 2015-03-22 MED ORDER — HYDROMORPHONE HCL 1 MG/ML IJ SOLN: 0.2500 mg | INTRAMUSCULAR | Status: DC | PRN

## 2015-03-22 MED ORDER — LIDOCAINE HCL (CARDIAC) 20 MG/ML IV SOLN
INTRAVENOUS | Status: DC | PRN
  Administered 2015-03-22: 80 mg via INTRAVENOUS

## 2015-03-22 MED ORDER — MIDAZOLAM HCL 2 MG/2ML IJ SOLN
1.0000 mg | INTRAMUSCULAR | Status: DC | PRN
  Administered 2015-03-22: 2 mg via INTRAVENOUS

## 2015-03-22 MED ORDER — OXYCODONE HCL 5 MG/5ML PO SOLN: 5.0000 mg | Freq: Once | ORAL | Status: DC | PRN

## 2015-03-22 MED ORDER — PROPOFOL 500 MG/50ML IV EMUL
INTRAVENOUS | Status: AC
  Filled 2015-03-22: qty 50

## 2015-03-22 SURGICAL SUPPLY — 84 items
APL SKNCLS STERI-STRIP NONHPOA (GAUZE/BANDAGES/DRESSINGS) ×2
BAG DECANTER FOR FLEXI CONT (MISCELLANEOUS) IMPLANT
BANDAGE ELASTIC 3 VELCRO ST LF (GAUZE/BANDAGES/DRESSINGS) IMPLANT
BANDAGE ELASTIC 4 VELCRO ST LF (GAUZE/BANDAGES/DRESSINGS) ×4 IMPLANT
BENZOIN TINCTURE PRP APPL 2/3 (GAUZE/BANDAGES/DRESSINGS) ×4 IMPLANT
BIT DRILL 2 FAST STEP (BIT) ×2 IMPLANT
BIT DRILL 2.5X4 QC (BIT) ×2 IMPLANT
BLADE MINI RND TIP GREEN BEAV (BLADE) IMPLANT
BLADE SURG 15 STRL LF DISP TIS (BLADE) ×4 IMPLANT
BLADE SURG 15 STRL SS (BLADE) ×8
BNDG CMPR 9X4 STRL LF SNTH (GAUZE/BANDAGES/DRESSINGS)
BNDG ESMARK 4X9 LF (GAUZE/BANDAGES/DRESSINGS) IMPLANT
BNDG GAUZE ELAST 4 BULKY (GAUZE/BANDAGES/DRESSINGS) ×4 IMPLANT
CANISTER SUCT 1200ML W/VALVE (MISCELLANEOUS) IMPLANT
CLOSURE WOUND 1/2 X4 (GAUZE/BANDAGES/DRESSINGS) ×1
CORDS BIPOLAR (ELECTRODE) ×4 IMPLANT
COVER BACK TABLE 60X90IN (DRAPES) ×4 IMPLANT
CUFF TOURNIQUET SINGLE 18IN (TOURNIQUET CUFF) IMPLANT
CUFF TOURNIQUET SINGLE 24IN (TOURNIQUET CUFF) ×2 IMPLANT
DECANTER SPIKE VIAL GLASS SM (MISCELLANEOUS) IMPLANT
DRAPE EXTREMITY T 121X128X90 (DRAPE) ×4 IMPLANT
DRAPE OEC MINIVIEW 54X84 (DRAPES) ×4 IMPLANT
DRAPE SURG 17X23 STRL (DRAPES) ×4 IMPLANT
DURAPREP 26ML APPLICATOR (WOUND CARE) ×4 IMPLANT
ELECT REM PT RETURN 9FT ADLT (ELECTROSURGICAL)
ELECTRODE REM PT RTRN 9FT ADLT (ELECTROSURGICAL) IMPLANT
GAUZE SPONGE 4X4 12PLY STRL (GAUZE/BANDAGES/DRESSINGS) ×4 IMPLANT
GAUZE SPONGE 4X4 16PLY XRAY LF (GAUZE/BANDAGES/DRESSINGS) IMPLANT
GAUZE XEROFORM 1X8 LF (GAUZE/BANDAGES/DRESSINGS) IMPLANT
GLOVE BIO SURGEON STRL SZ 6.5 (GLOVE) ×1 IMPLANT
GLOVE BIO SURGEONS STRL SZ 6.5 (GLOVE) ×1
GLOVE BIOGEL M STRL SZ7.5 (GLOVE) ×2 IMPLANT
GLOVE BIOGEL PI IND STRL 7.0 (GLOVE) IMPLANT
GLOVE BIOGEL PI INDICATOR 7.0 (GLOVE) ×4
GLOVE SURG SYN 8.0 (GLOVE) ×8 IMPLANT
GLOVE SURG SYN 8.0 PF PI (GLOVE) ×4 IMPLANT
GOWN STRL REUS W/ TWL LRG LVL3 (GOWN DISPOSABLE) ×2 IMPLANT
GOWN STRL REUS W/TWL LRG LVL3 (GOWN DISPOSABLE) ×4
GOWN STRL REUS W/TWL XL LVL3 (GOWN DISPOSABLE) ×8 IMPLANT
KIT ASCP FXDISP 3X8XBTNDS (KITS) IMPLANT
KIT BIO-TENODESIS 3X8 DISP (KITS)
NDL HYPO 25X1 1.5 SAFETY (NEEDLE) ×2 IMPLANT
NEEDLE HYPO 25X1 1.5 SAFETY (NEEDLE) ×4 IMPLANT
NS IRRIG 1000ML POUR BTL (IV SOLUTION) ×4 IMPLANT
PACK BASIN DAY SURGERY FS (CUSTOM PROCEDURE TRAY) ×4 IMPLANT
PAD CAST 3X4 CTTN HI CHSV (CAST SUPPLIES) ×2 IMPLANT
PAD CAST 4YDX4 CTTN HI CHSV (CAST SUPPLIES) IMPLANT
PADDING CAST ABS 4INX4YD NS (CAST SUPPLIES) ×2
PADDING CAST ABS COTTON 4X4 ST (CAST SUPPLIES) ×2 IMPLANT
PADDING CAST COTTON 3X4 STRL (CAST SUPPLIES) ×4
PADDING CAST COTTON 4X4 STRL (CAST SUPPLIES)
PEG FULLY THREADED 2.5X22MM (Peg) ×2 IMPLANT
PEG SUBCHONDRAL SMOOTH 2.0X18 (Peg) ×2 IMPLANT
PEG SUBCHONDRAL SMOOTH 2.0X20 (Peg) ×4 IMPLANT
PEG SUBCHONDRAL SMOOTH 2.0X22 (Peg) ×8 IMPLANT
PEG SUBCHONDRAL SMOOTH 2.0X24 (Peg) ×6 IMPLANT
PENCIL BUTTON HOLSTER BLD 10FT (ELECTRODE) IMPLANT
PLATE STAN 24.4X59.5 LT (Plate) ×2 IMPLANT
SCREW BN 12X3.5XNS CORT TI (Screw) IMPLANT
SCREW CORT 3.5X12 (Screw) ×4 IMPLANT
SCREW CORT 3.5X14 LNG (Screw) ×4 IMPLANT
SHEET MEDIUM DRAPE 40X70 STRL (DRAPES) ×4 IMPLANT
SPLINT PLASTER CAST XFAST 3X15 (CAST SUPPLIES) IMPLANT
SPLINT PLASTER CAST XFAST 4X15 (CAST SUPPLIES) ×10 IMPLANT
SPLINT PLASTER XTRA FAST SET 4 (CAST SUPPLIES) ×10
SPLINT PLASTER XTRA FASTSET 3X (CAST SUPPLIES)
STOCKINETTE 4X48 STRL (DRAPES) ×4 IMPLANT
STRIP CLOSURE SKIN 1/2X4 (GAUZE/BANDAGES/DRESSINGS) ×3 IMPLANT
SUCTION FRAZIER TIP 10 FR DISP (SUCTIONS) IMPLANT
SUT ETHILON 4 0 PS 2 18 (SUTURE) IMPLANT
SUT MERSILENE 4 0 P 3 (SUTURE) IMPLANT
SUT PROLENE 3 0 PS 2 (SUTURE) ×4 IMPLANT
SUT SILK 2 0 FS (SUTURE) IMPLANT
SUT VIC AB 0 SH 27 (SUTURE) ×4 IMPLANT
SUT VIC AB 3-0 FS2 27 (SUTURE) IMPLANT
SUT VIC AB 4-0 RB1 18 (SUTURE) ×4 IMPLANT
SUT VICRYL RAPIDE 4-0 (SUTURE) IMPLANT
SUT VICRYL RAPIDE 4/0 PS 2 (SUTURE) IMPLANT
SYR BULB 3OZ (MISCELLANEOUS) ×4 IMPLANT
SYRINGE 10CC LL (SYRINGE) ×4 IMPLANT
TOWEL OR 17X24 6PK STRL BLUE (TOWEL DISPOSABLE) ×4 IMPLANT
TUBE CONNECTING 20'X1/4 (TUBING)
TUBE CONNECTING 20X1/4 (TUBING) IMPLANT
UNDERPAD 30X30 (UNDERPADS AND DIAPERS) ×4 IMPLANT

## 2015-03-22 NOTE — Transfer of Care (Signed)
Immediate Anesthesia Transfer of Care Note  Patient: Shelly Ramirez  Procedure(s) Performed: Procedure(s): OPEN REDUCTION INTERNAL FIXATION (ORIF) LEFT DISTAL RADIAL FRACTURE (Left) LEFT CARPAL TUNNEL RELEASE (Left)  Patient Location: PACU  Anesthesia Type:GA combined with regional for post-op pain  Level of Consciousness: awake, alert  and patient cooperative  Airway & Oxygen Therapy: Patient Spontanous Breathing and Patient connected to face mask oxygen  Post-op Assessment: Report given to RN, Post -op Vital signs reviewed and stable and Patient moving all extremities  Post vital signs: Reviewed and stable  Last Vitals:  Filed Vitals:   03/22/15 1140  BP:   Pulse: 75  Temp:   Resp: 15    Complications: No apparent anesthesia complications

## 2015-03-22 NOTE — Progress Notes (Signed)
  Assisted Dr. Crews with left, ultrasound guided, supraclavicular block. Side rails up, monitors on throughout procedure. See vital signs in flow sheet. Tolerated Procedure well. 

## 2015-03-22 NOTE — H&P (Signed)
Shelly Ramirez is an 39 y.o. female.   Chief Complaint: left wrist pain with median numbness HPI: as above s/p Shelly Ramirez with displaced left distal radius fracture  Past Medical History  Diagnosis Date  . Displaced comminuted fracture of shaft of left radius 02/2015  . 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 Cesarean section      x 2    History reviewed. No pertinent family history. Social History:  reports that she has never smoked. She has never used smokeless tobacco. She reports that she does not drink alcohol or use illicit drugs.  Allergies: No Known Allergies  Medications Prior to Admission  Medication Sig Dispense Refill  . oxyCODONE-acetaminophen (PERCOCET/ROXICET) 5-325 MG per tablet Take 2 tablets by mouth every 4 (four) hours as needed for severe pain. 20 tablet 0    No results found for this or any previous visit (from the past 48 hour(s)). Dg Wrist Complete Left  03/20/2015   CLINICAL DATA:  Post reduction.  EXAM: LEFT WRIST - COMPLETE 3+ VIEW  COMPARISON:  Earlier today.  FINDINGS: There is an overlying cast obscuring evaluation of bony detail. Reduction of patient's previously seen 1 shaft's with of posterior displacement of the distal radial fracture with mild residual displacement of this comminuted distal radial fracture. No change in displaced ulnar styloid fracture. Remainder of the exam is unchanged.  IMPRESSION: Minimal residual displacement of patient's comminuted distal radial fracture. Displaced ulnar styloid fracture unchanged. Overlying cast.   Electronically Signed   By: Marin Olp M.D.   On: 03/20/2015 16:14   Dg Wrist Complete Left  03/20/2015   CLINICAL DATA:  [address] backwards off a porch. Railing gave way. This occurred 2 days ago.  EXAM: LEFT WRIST - COMPLETE 3+ VIEW  COMPARISON:  Radiographs from [facility] 03/18/2015.  FINDINGS: Dorsally displaced and comminuted distal radius fracture. The  distal radius fragments are completely dorsal to the shaft of the radius. There is associated moderate deformity. Carpal bones maintain normal articulation with the distal radius fragment. No visible carpal bone fracture. There is an ulnar styloid avulsion. Soft tissue swelling. Similar appearance compared to the films 2 days ago.  IMPRESSION: Severely displaced and comminuted distal radius fracture. Ulnar styloid avulsion. Soft tissue swelling.   Electronically Signed   By: Staci Righter M.D.   On: 03/20/2015 14:10    Review of Systems  All other systems reviewed and are negative.   Height 5\' 11"  (1.803 m), weight 131.543 kg (290 lb), last menstrual period 03/09/2015. Physical Exam  Constitutional: She is oriented to person, place, and time. She appears well-developed and well-nourished.  HENT:  Head: Normocephalic and atraumatic.  Cardiovascular: Normal rate.   Respiratory: Effort normal.  Musculoskeletal:       Left wrist: She exhibits tenderness, bony tenderness, swelling and deformity.  Displaced left distal radius fracture with median nerve symptoms  Neurological: She is alert and oriented to person, place, and time.  Skin: Skin is warm.  Psychiatric: She has a normal mood and affect. Her behavior is normal. Judgment and thought content normal.     Assessment/Plan As above   Plan ORIF with CTR  Saia Derossett A 03/22/2015, 9:02 AM

## 2015-03-22 NOTE — Op Note (Signed)
See [id number]

## 2015-03-22 NOTE — Anesthesia Postprocedure Evaluation (Signed)
  Anesthesia Post-op Note  Patient: Shelly Ramirez  Procedure(s) Performed: Procedure(s): OPEN REDUCTION INTERNAL FIXATION (ORIF) LEFT DISTAL RADIAL FRACTURE (Left) LEFT CARPAL TUNNEL RELEASE (Left)  Patient Location: PACU  Anesthesia Type: General, Regional   Level of Consciousness: awake, alert  and oriented  Airway and Oxygen Therapy: Patient Spontanous Breathing  Post-op Pain: none  Post-op Assessment: Post-op Vital signs reviewed  Post-op Vital Signs: Reviewed  Last Vitals:  Filed Vitals:   03/22/15 1246  BP: 161/96  Pulse: 70  Temp: 37.1 C  Resp:     Complications: No apparent anesthesia complications

## 2015-03-22 NOTE — Anesthesia Procedure Notes (Addendum)
Anesthesia Regional Block:  Supraclavicular block  Pre-Anesthetic Checklist: ,, timeout performed, Correct Patient, Correct Site, Correct Laterality, Correct Procedure, Correct Position, site marked, Risks and benefits discussed,  Surgical consent,  Pre-op evaluation,  At surgeon's request and post-op pain management  Laterality: Left and Upper  Prep: chloraprep       Needles:  Injection technique: Single-shot  Needle Type: Echogenic Stimulator Needle     Needle Length: 5cm 5 cm Needle Gauge: 21 and 21 G    Additional Needles:  Procedures: ultrasound guided (picture in chart) Supraclavicular block Narrative:  Start time: 03/22/2015 9:28 AM End time: 03/22/2015 9:33 AM Injection made incrementally with aspirations every 5 mL.  Performed by: Personally  Anesthesiologist: CREWS, DAVID   Procedure Name: LMA Insertion Date/Time: 03/22/2015 9:47 AM Performed by: Maryella Shivers Pre-anesthesia Checklist: Patient identified, Emergency Drugs available, Suction available and Patient being monitored Patient Re-evaluated:Patient Re-evaluated prior to inductionOxygen Delivery Method: Circle System Utilized Preoxygenation: Pre-oxygenation with 100% oxygen Intubation Type: IV induction Ventilation: Mask ventilation without difficulty LMA: LMA inserted LMA Size: 4.0 Number of attempts: 1 Airway Equipment and Method: Bite block Placement Confirmation: positive ETCO2 Tube secured with: Tape Dental Injury: Teeth and Oropharynx as per pre-operative assessment

## 2015-03-22 NOTE — Discharge Instructions (Signed)
°  Post Anesthesia Home Care Instructions ° °Activity: °Get plenty of rest for the remainder of the day. A responsible adult should stay with you for 24 hours following the procedure.  °For the next 24 hours, DO NOT: °-Drive a car °-Operate machinery °-Drink alcoholic beverages °-Take any medication unless instructed by your physician °-Make any legal decisions or sign important papers. ° °Meals: °Start with liquid foods such as gelatin or soup. Progress to regular foods as tolerated. Avoid greasy, spicy, heavy foods. If nausea and/or vomiting occur, drink only clear liquids until the nausea and/or vomiting subsides. Call your physician if vomiting continues. ° °Special Instructions/Symptoms: °Your throat may feel dry or sore from the anesthesia or the breathing tube placed in your throat during surgery. If this causes discomfort, gargle with warm salt water. The discomfort should disappear within 24 hours. ° °If you had a scopolamine patch placed behind your ear for the management of post- operative nausea and/or vomiting: ° °1. The medication in the patch is effective for 72 hours, after which it should be removed.  Wrap patch in a tissue and discard in the trash. Wash hands thoroughly with soap and water. °2. You may remove the patch earlier than 72 hours if you experience unpleasant side effects which may include dry mouth, dizziness or visual disturbances. °3. Avoid touching the patch. Wash your hands with soap and water after contact with the patch. °  °Regional Anesthesia Blocks ° °1. Numbness or the inability to move the "blocked" extremity may last from 3-48 hours after placement. The length of time depends on the medication injected and your individual response to the medication. If the numbness is not going away after 48 hours, call your surgeon. ° °2. The extremity that is blocked will need to be protected until the numbness is gone and the  Strength has returned. Because you cannot feel it, you will need  to take extra care to avoid injury. Because it may be weak, you may have difficulty moving it or using it. You may not know what position it is in without looking at it while the block is in effect. ° °3. For blocks in the legs and feet, returning to weight bearing and walking needs to be done carefully. You will need to wait until the numbness is entirely gone and the strength has returned. You should be able to move your leg and foot normally before you try and bear weight or walk. You will need someone to be with you when you first try to ensure you do not fall and possibly risk injury. ° °4. Bruising and tenderness at the needle site are common side effects and will resolve in a few days. ° °5. Persistent numbness or new problems with movement should be communicated to the surgeon or the [facility] ([phone])/ [facility] ([phone]). °

## 2015-03-22 NOTE — Anesthesia Preprocedure Evaluation (Signed)
Anesthesia Evaluation  Patient identified by MRN, date of birth, ID band Patient awake    Reviewed: Allergy & Precautions, NPO status , Patient's Chart, lab work & pertinent test results  Airway Mallampati: I  TM Distance: >3 FB Neck ROM: Full    Dental  (+) Teeth Intact, Dental Advisory Given   Pulmonary  breath sounds clear to auscultation        Cardiovascular Rhythm:Regular Rate:Normal     Neuro/Psych    GI/Hepatic   Endo/Other  Morbid obesity  Renal/GU      Musculoskeletal   Abdominal   Peds  Hematology   Anesthesia Other Findings   Reproductive/Obstetrics                             Anesthesia Physical Anesthesia Plan  ASA: II  Anesthesia Plan: General and Regional   Post-op Pain Management:    Induction: Intravenous  Airway Management Planned: LMA  Additional Equipment:   Intra-op Plan:   Post-operative Plan: Extubation in OR  Informed Consent: I have reviewed the patients History and Physical, chart, labs and discussed the procedure including the risks, benefits and alternatives for the proposed anesthesia with the patient or authorized representative who has indicated his/her understanding and acceptance.   Dental advisory given  Plan Discussed with: CRNA, Anesthesiologist and Surgeon  Anesthesia Plan Comments:         Anesthesia Quick Evaluation

## 2015-03-23 ENCOUNTER — Encounter (HOSPITAL_BASED_OUTPATIENT_CLINIC_OR_DEPARTMENT_OTHER): Payer: Self-pay | Admitting: Orthopedic Surgery

## 2015-03-23 NOTE — Op Note (Signed)
NAMEMarland Kitchen  DAPHANIE, OQUENDO NO.:  [id number]  MEDICAL RECORD NO.:  [id number]  LOCATION:                               FACILITY:  [facility]  PHYSICIAN:  Sheral Apley. Marcia Lepera, M.D.DATE OF BIRTH:  05/31/1976  DATE OF PROCEDURE:  03/22/2015 DATE OF DISCHARGE:  03/22/2015                              OPERATIVE REPORT   PREOPERATIVE DIAGNOSIS:  Displaced intra-articular fracture, distal radius on the left side with carpal tunnel syndrome.  POSTOPERATIVE DIAGNOSIS:  Displaced intra-articular fracture, distal radius on the left side with carpal tunnel syndrome.  PROCEDURE:  Open reduction internal fixation, with DVR plate and screws, standard left plate as well as carpal tunnel release.  SURGEON:  Sheral Apley. Burney Gauze, M.D.  ASSISTANT:  None.  ANESTHESIA:  Sympathetic block and general.  COMPLICATIONS:  No complications.  DRAINS:  No drains.  DESCRIPTION OF PROCEDURE:  After induction of adequate Sympathetic block analgesia and then general laryngeal mask airway anesthetic, left upper extremity was prepped and draped in sterile fashion.  An Esmarch was used to exsanguinate the limb.  Tourniquet was inflated to 275 mmHg.  At this point, an incision was made in the palmar aspect of the distal forearm and wrist area on the left side over the palpable border of flexor carpi radialis tendon.  Skin was incised sharply 5-6 cm.  The sheath overlying the FCR was identified and incised.  The FCR was tracked to the midline.  The radial artery to the lateral side.  The fascia in this area was incised.  Dissection was carried down the pronator quadratus.  We subperiosteally stripped the pronator quadratus off the proximal and distal fragments.  We then released brachioradialis off the distal fragment to aid in reduction.  Reduction was performed with flexion, ulnar deviation, slight distraction.  We placed a standard DVR plate, left on the proximal fragment to the slotted hole; and  using direct and fluoroscopic guidance, we determined adequate plate position. This was then fixed with three cortical screws proximally, followed by smooth pegs distally.  Intraoperative fluoroscopy unfortunately revealed that the plate was not seated well on the distal fragment.  All the hardware was removed.  We then re-reduced the fracture.  We placed the plate using the same technique with good positioning of the plate in all three views.  We then secured with three cortical screws proximally and smooth pegs distally again.  At this point in time, intraoperative fluoroscopy revealed near-anatomic reduction in AP, lateral, and oblique views.  The wound was then thoroughly irrigated.  We then identified the median nerve in  the proximal aspect of the wound tracing the edge of the transverse carpal ligament proximally.  We identified and protected the palmar cutaneous branch of the median nerve.  We then released the transverse carpal ligament from proximal to distal under direct vision releasing the median nerve.  The wound was then thoroughly irrigated. It was closed in layers of 2-0 undyed Vicryl to reapproximate the pronator quadratus, 4-0 Vicryl subcutaneously, and a 3- 0 Prolene subcuticular stitch on the skin.  Steri-Strips, 4x4s, fluffs, and a compressive dressing was applied as well as a volar splint.  The patient tolerated all  procedures well, went to the recovery room in stable fashion.     Sheral Apley Burney Gauze, M.D.     MAW/MEDQ  D:  03/22/2015  T:  03/22/2015  Job:  [id number]

## 2015-05-12 ENCOUNTER — Ambulatory Visit (HOSPITAL_COMMUNITY): Payer: Self-pay | Attending: Orthopedic Surgery | Admitting: Specialist

## 2015-05-18 ENCOUNTER — Ambulatory Visit (HOSPITAL_COMMUNITY): Payer: Self-pay | Admitting: Occupational Therapy

## 2015-05-26 ENCOUNTER — Ambulatory Visit (HOSPITAL_COMMUNITY): Payer: Self-pay | Admitting: Specialist

## 2015-05-29 ENCOUNTER — Ambulatory Visit (HOSPITAL_COMMUNITY): Payer: Self-pay

## 2015-05-31 ENCOUNTER — Ambulatory Visit (HOSPITAL_COMMUNITY): Payer: Self-pay | Attending: Orthopedic Surgery

## 2015-05-31 ENCOUNTER — Encounter (HOSPITAL_COMMUNITY): Payer: Self-pay

## 2015-05-31 DIAGNOSIS — M25632 Stiffness of left wrist, not elsewhere classified: Secondary | ICD-10-CM | POA: Insufficient documentation

## 2015-05-31 DIAGNOSIS — M25432 Effusion, left wrist: Secondary | ICD-10-CM | POA: Insufficient documentation

## 2015-05-31 DIAGNOSIS — X58XXXA Exposure to other specified factors, initial encounter: Secondary | ICD-10-CM | POA: Insufficient documentation

## 2015-05-31 DIAGNOSIS — S52502A Unspecified fracture of the lower end of left radius, initial encounter for closed fracture: Secondary | ICD-10-CM | POA: Insufficient documentation

## 2015-05-31 DIAGNOSIS — M25532 Pain in left wrist: Secondary | ICD-10-CM | POA: Insufficient documentation

## 2015-05-31 NOTE — Therapy (Signed)
[facility] [address] [place], [place], [address] Phone: [phone]   Fax:  [phone]  Occupational Therapy Evaluation  Patient Details  Name: Shelly Ramirez MRN: [id number] Date of Birth: Oct 21, 1975 Referring Provider:  Charlotte Crumb, MD  Encounter Date: 05/31/2015      OT End of Session - 05/31/15 1313    Visit Number 1   Number of Visits 1   Authorization Type Self Pay   OT Start Time 2355   OT Stop Time 1120   OT Time Calculation (min) 65 min   Activity Tolerance Patient tolerated treatment well   Behavior During Therapy [facility] for tasks assessed/performed      Past Medical History  Diagnosis Date  . Displaced comminuted fracture of shaft of left radius 02/2015  . 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 Cesarean section      x 2  . Open reduction internal fixation (orif) distal radial fracture Left 03/22/2015    Procedure: OPEN REDUCTION INTERNAL FIXATION (ORIF) LEFT DISTAL RADIAL FRACTURE;  Surgeon: Charlotte Crumb, MD;  Location: [facility];  Service: Orthopedics;  Laterality: Left;  . Carpal tunnel release Left 03/22/2015    Procedure: LEFT CARPAL TUNNEL RELEASE;  Surgeon: Charlotte Crumb, MD;  Location: [place];  Service: Orthopedics;  Laterality: Left;    There were no vitals filed for this visit.  Visit Diagnosis:  Distal radius fracture, left, closed, initial encounter - Plan: Ot plan of care cert/re-cert  Wrist pain, acute, left - Plan: Ot plan of care cert/re-cert  Stiffness of wrist joint, left - Plan: Ot plan of care cert/re-cert  Swelling of wrist joint, left - Plan: Ot plan of care cert/re-cert      Subjective Assessment - 05/31/15 1245    Subjective  S: I just want to be able to use my left hand again.    Patient is accompained by: Family member   Pertinent History Patient is a 39 y/o female S/P left ORIF  distal radius fracture which reulted after a handicap ramp railing came off when she was leaning on it. Pt fell and landed on left wrist. Fall occurred on 03/18/15. Pt underwent surgery on 03/22/15. No previous therapy has been performed. Patient was referred by Dr. Burney Gauze for 1 time evaluation for HEP.     Special Tests None   Patient Stated Goals I want my arm back like it was or close to it.    Currently in Pain? Yes   Pain Score 5    Pain Location Wrist   Pain Orientation Left   Pain Descriptors / Indicators Constant;Burning;Pins and needles   Pain Type Acute pain   Pain Onset More than a month ago   Pain Frequency Constant   Pain Relieving Factors Pain medication lessens the pain. Once it wears off the pain returns full force.           [facility] OT Assessment - 05/31/15 1252    Assessment   Diagnosis S/P ORIF left distal radius fracture   Onset Date 03/22/15   Prior Therapy None   Precautions   Precautions None   Restrictions   Weight Bearing Restrictions No   Balance Screen   Has the patient fallen in the past 6 months No   Has the patient had a decrease in activity level because of a fear of falling?  No   Is the  patient reluctant to leave their home because of a fear of falling?  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    Mobility   Mobility Status Independent   Written Expression   Dominant Hand Right   Vision - History   Baseline Vision Wears glasses all the time   Cognition   Overall Cognitive Status Within Functional Limits for tasks assessed   Sensation   Light Touch Impaired by gross assessment   Hot/Cold Impaired by gross assessment   Additional Comments Increased sensitivity left hand distal  to proximal starting at bottom of scar to finger tips of left hand.    Edema   Edema Mild swelling in left hand volar and dorsal region as well has wrist and forearm. Pt was given edema glove and educated on use to reduce edema.    ROM / Strength   AROM / PROM / Strength AROM;PROM;Strength   Palpation   Palpation comment Max fascial restrictions in left volar forearm, wrist and hand/digit region.    AROM   AROM Assessment Site Forearm;Wrist;Finger;Thumb   Right/Left Forearm Left   Left Forearm Pronation --  WFL   Left Forearm Supination --  75% range   Right/Left Wrist Left   Left Wrist Extension 0 Degrees   Left Wrist Flexion --  WFL   Left Wrist Radial Deviation --  WFL   Left Wrist Ulnar Deviation --  WFL   Right/Left Finger Left   Left Composite Finger Extension 75%   Left Composite Finger Flexion 75%   Right/Left Thumb Left   Left Thumb Opposition Digit 5   PROM   PROM Assessment Site Forearm;Wrist;Finger;Thumb   Right/Left Forearm Left   Left Forearm Pronation --  F/ROM   Left Forearm Supination --  F/ROM   Right/Left Wrist Left   Left Wrist Extension 0 Degrees   Left Wrist Flexion --  F/ROM   Left Wrist Radial Deviation --  F/ROM   Left Wrist Ulnar Deviation --  F/ROM   Right/Left Finger Left   Left Composite Finger Extension --  100%   Left Composite Finger Flexion --  100%   Right/Left Thumb Left   Left Thumb Opposition Digit 5   Strength   Overall Strength Unable to assess;Due to pain   Strength Assessment Site Hand   Right/Left hand Left;Right   Right Hand Grip (lbs) 90   Left Hand Grip (lbs) 15   Sensation Exercises   Desensitization Education given with handout                         OT Education - 05/31/15 1306    Education provided Yes   Education Details Scar massage, self massage, desensitization, P/ROM wrist and forearm, coordination exercises/activities, theraputty exercises (yellow and red)   Person(s) Educated  Patient;Parent(s)   Methods Explanation;Demonstration;Handout   Comprehension Verbalized understanding          OT Short Term Goals - 05/31/15 1320    OT SHORT TERM GOAL #1   Title Patient will be educated and independent with HEP.   Time 1   Period Days   Status Achieved  Plan - 05/31/15 1315    Clinical Impression Statement A: Patient is a 39 y/o female S/P left ORIF distal radius fracture causing increased pain, fascial restrictions, edema and decrease strength and ROM resulting in difficulty completing daily and work tasks using LUE. Patient was assessed and therapist provided detailed information for HEP which included scar massage, edema managememt (compression glove and contrast bath), desensitization techniques, A/ROM of wrist, coordination activities, and theraputty for grip and pinch strengthening. Pt verablized understanding and all questions answered.    Pt will benefit from skilled therapeutic intervention in order to improve on the following deficits (Retired) Decreased strength;Pain;Impaired sensation;Impaired UE functional use;Increased edema;Decreased range of motion;Increased fascial restricitons;Decreased coordination;Decreased scar mobility   Rehab Potential Excellent   OT Frequency One time visit   OT Duration --  1 week   OT Treatment/Interventions Scar mobilization;Patient/family education   Plan P: 1 time visit with HEP.   Consulted and Agree with Plan of Care Patient;Family member/caregiver   Family Member Consulted Mother        Problem List There are no active problems to display for this patient.   Ailene Ravel, OTR/L,CBIS  [phone]  05/31/2015, 1:24 PM  [facility] [address], [place], [address] Phone: [phone]   Fax:  [phone]

## 2015-05-31 NOTE — Patient Instructions (Addendum)
DESENSITIZING YOUR HAND  What is "desensitization"?  Following an injury to the hand a painful increase in sensation sometimes develops.  This hypersensitivity may be in response to touch, cold temperatures or in severe cases, even to wind blowing on the hand.  This hypersensitivity may be localized to a small area or may be widespread over an entire hand.  The initial injury causing the hypersensitivity may either be a rather severe injury but may times is only a trivial injury.  Desensitization is the process of decreasing the painful hypersensitivity by gradually exposing the sensitive area to a variety of carefully selected stimuli.  What will happen if my hand is not desensitized?  Over-protecting the sensitive are by not using it or covering it with a bandage or glove will only cause the sensitive area to become more painful to touch.  This, in turn, leads to increased stiffness, weakness and loss of use in the hand.  How do I desensitize my hand?  The goal of desensitization is to gradually build up a tolerance to stronger and stronger stimuli as the level of pain decreases.  A variety of objects of different textures are used to rub against the sensitive hand in such a way that the coarseness of the textures and the rubbing time are gradually increased as the pain diminishes over a period of days and weeks.  The following steps have been set up by your hand therapist and surgeon to serve as a general guide during your therapy and to help you better understand the proper techniques involved so that ultimately a more pain-free, useful hand is obtained.  1. Warm (not hot) water soaks or heating pad on a low setting. 2. Lotion 3. Rubbing exercises:  Using your normal hand, gently rub the most hypersensitive area with a back and forth motion for 30 seconds, rest briefly, and then repeat.  As tolerance to rubbing improves, slowly increase the pressure of rubbing one hand against the  other.  Gradually work up to a deep circular massage.  Once deep massage is tolerated, advance to gently rubbing the sensitive hand against selected materials with increasingly rougher surfaces as indicated below.  It is important to begin with soft, smooth textures and gradually work up to rough, hard textures.  Suggested sequence of desensitization materials: 1)  Cotton balls  2)  Flannel shirt or cotton shirt  3)  Soft velvet or felt  4)  Pants  5)  Towel  6)  Upholstery or rug 7)  Sand paper   Submerge hand into containers of the following household materials and slowly move your fingers about, grasping the materials and releasing them, it is beneficial to submerge the hand quickly and then pull it back out... Work on increasing speed!  The following materials are recommended: cotton balls, popcorn, rice, macaroni, beans or sand.  Rub a brush along the hand.  Massage the hand with vibrating instruments such as an Copy, electric toothbrush or vibrator.  Tapping exercises: finger can be tapped against the various textures listed above.  Gradually increase the speed and force used with tapping.  To improve your fine motor coordination and to encourage normal use of the hand the following exercises are useful: 1.  Nuts and bolts 2.  Clothes pins 3.  Picking up  4.  Using a typewriter  5.  Piano  Avoid 1. Hot water.  This may increase swelling and cause more pain and stiffness. 2. Painful activities.  Desensitization may  be somewhat uncomfortable, but it should not actually be painful. 3. Over-protection. Again, covering the sensitive area or avoiding use of the painful finger or hand will only increase painful sensitivity.  Therefore, excessive pain, heat and over-protection may do more harm than good.  You should not experience increased pain and swelling following your desensitization exercises.  Your therapist or surgeon will tailor the protocol to help meet your specific  needs.  Do not hesitate to ask them any questions that you may have.  USE THE HAND AS NORMALLY AS POSSIBLE.   WRIST FLEXION - AROM - THIGH  Rest your arm on your thigh and bend at your wrist up and down with your palm face up as shown. Return to original position and repeat. 10 times. Complete 2-3 times a day.   WRSIT EXTENSION - AROM - THIGH  Rest your arm on your thigh and bend at your wrist up and down with your palm face down as shown. Return to original position and repeat. 10 times. Complete 2-3 times a day.   WRIST SUPINATION STRETCH  Grasp your wrist as shown and gently turn your affected wrist towards palm face up.   Keep your elbow bent and by the side of your  body.  Repeat 10 times. Complete 2-3 times a day.    WRIST PRONATION STRETCH  Grasp your wrist as shown and gently turn your affected wrist towards palm face down.   Keep your elbow bent and by the side of your  body.  Repeat 10 times. Complete 2-3 times a day.   Coordination Exercises  Perform the following exercises for 30 minutes 2-3 times per day. Perform with left hand(s). Perform using big movements.   Flipping Cards: Place deck of cards on the table. Flip cards over by opening your hand big to grasp and then turn your palm up big.  Deal cards: Hold 1/2 or whole deck in your hand. Use thumb to push card off top of deck with one big push.  Flip card between each finger.  Pick up coins and place in coin bank or container: Pick up with big, intentional movements. Do not drag coin to the edge.  Pick up coins and stack one at a time: Pick up with big, intentional movements. Do not drag coin to the edge. (5-10 in a stack)  Pick up 5-10 coins one at a time and hold in palm. Then, move coins from palm to fingertips one at time and place in coin bank/container.    Fine Motor Coordination Exercises  Perform the following exercises 2-3 times a day, as recommended by your occupational therapist.   Close  all fingers and thumb into a tight fist and then open wide. (10 times)  Lift fingers and thumb off table one at a time. Increase speed as able. (10 times)  Thumb circles. (10 times)  Pick up 5 small objects (coins, marbles, paperclips, beads, etc.) one at a time and hold them in hand, then place them one by one onto the table.  Stack approximately [employer] (checkers, coins, etc.) onto table.  Paperwork: practice folding, stuffing envelopes, addressing envelopes, stapling, using paperclips and tape.  With tweezers, pick up small objects and put into a small container. Try sorting beads or buttons.   Home Exercises Program Theraputty Exercises  Do the following exercises 2-3 times a day using your affected hand.  1. Roll putty into a ball.  2. Make into a pancake.  3. Roll putty into  a roll.  4. Pinch along log with first finger and thumb.   5. Make into a ball.  6. Roll it back into a log.   7. Pinch using thumb and side of first finger.  8. Roll into a ball, then flatten into a pancake.  9. Using your fingers, make putty into a mountain.

## 2015-12-07 ENCOUNTER — Emergency Department (HOSPITAL_COMMUNITY): Payer: Self-pay

## 2015-12-07 ENCOUNTER — Encounter (HOSPITAL_COMMUNITY): Payer: Self-pay

## 2015-12-07 ENCOUNTER — Emergency Department (HOSPITAL_COMMUNITY)
Admission: EM | Admit: 2015-12-07 | Discharge: 2015-12-07 | Disposition: A | Discharge status: Home / Self Care | Payer: Self-pay | Attending: Emergency Medicine | Admitting: Emergency Medicine

## 2015-12-07 DIAGNOSIS — D1779 Benign lipomatous neoplasm of other sites: Secondary | ICD-10-CM | POA: Insufficient documentation

## 2015-12-07 DIAGNOSIS — D179 Benign lipomatous neoplasm, unspecified: Secondary | ICD-10-CM

## 2015-12-07 DIAGNOSIS — D649 Anemia, unspecified: Secondary | ICD-10-CM

## 2015-12-07 LAB — BASIC METABOLIC PANEL
ANION GAP: 7 (ref 5–15)
BUN: 10 mg/dL (ref 6–20)
CALCIUM: 8.8 mg/dL — AB (ref 8.9–10.3)
CHLORIDE: 105 mmol/L (ref 101–111)
CO2: 26 mmol/L (ref 22–32)
CREATININE: 0.75 mg/dL (ref 0.44–1.00)
GFR calc non Af Amer: >60 mL/min (ref >60)
GLUCOSE: 83 mg/dL (ref 65–99)
Potassium: 4 mmol/L (ref 3.5–5.1)
Sodium: 138 mmol/L (ref 135–145)

## 2015-12-07 LAB — CBC WITH DIFFERENTIAL/PLATELET
Basophils Absolute: 0.1 10*3/uL (ref 0.0–0.1)
Basophils Relative: 1 %
Eosinophils Absolute: 0.1 10*3/uL (ref 0.0–0.7)
Eosinophils Relative: 1 %
HCT: 30.8 % — ABNORMAL LOW (ref 36.0–46.0)
Hemoglobin: 9.8 g/dL — ABNORMAL LOW (ref 12.0–15.0)
LYMPHS PCT: 28 %
Lymphs Abs: 3.3 10*3/uL (ref 0.7–4.0)
MCH: 22.2 pg — ABNORMAL LOW (ref 26.0–34.0)
MCHC: 31.8 g/dL (ref 30.0–36.0)
MCV: 69.8 fL — AB (ref 78.0–100.0)
MONO ABS: 0.7 10*3/uL (ref 0.1–1.0)
MONOS PCT: 6 %
NEUTROS PCT: 65 %
Neutro Abs: 7.6 10*3/uL (ref 1.7–7.7)
Platelets: 454 10*3/uL — ABNORMAL HIGH (ref 150–400)
RBC: 4.41 MIL/uL (ref 3.87–5.11)
RDW: 18.1 % — AB (ref 11.5–15.5)
WBC: 11.6 10*3/uL — ABNORMAL HIGH (ref 4.0–10.5)

## 2015-12-07 LAB — I-STAT BETA HCG BLOOD, ED (MC, WL, AP ONLY): I-stat hCG, quantitative: <5 m[IU]/mL (ref <5)

## 2015-12-07 MED ORDER — IOPAMIDOL (ISOVUE-300) INJECTION 61%
75.0000 mL | Freq: Once | INTRAVENOUS | Status: AC | PRN
Start: 2015-12-07 — End: 2015-12-07
  Administered 2015-12-07: 75 mL via INTRAVENOUS

## 2015-12-07 NOTE — ED Notes (Signed)
Pt reports was told here in 2012 she had a lipoma on left chest near collar bone.  Pt says the area has been getting larger for the past year.  Denies any pain or tenderness.

## 2015-12-07 NOTE — Discharge Instructions (Signed)
CT scan confirms a LIPOMA.     Follow-up appointment given to you as discussed. You are also anemic. Take multivitamin with iron daily.

## 2015-12-07 NOTE — ED Provider Notes (Signed)
CSN: [id number]     Arrival date & time 12/07/15  1157 History   First MD Initiated Contact with Patient 12/07/15 1213     Chief Complaint  Patient presents with  . swelling to left chest      (Consider location/radiation/quality/duration/timing/severity/associated sxs/prior Treatment) HPI..... Mass on left anterior chest wall for 2-3 years, getting bigger. Patient was seen for this similar problem in 2013.  No fever, sweats, chills, weight loss. Severity is mild to moderate. Nothing makes symptoms better or worse.  Past Medical History  Diagnosis Date  . Displaced comminuted fracture of shaft of left radius 02/2015  . 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 Cesarean section      x 2  . Open reduction internal fixation (orif) distal radial fracture Left 03/22/2015    Procedure: OPEN REDUCTION INTERNAL FIXATION (ORIF) LEFT DISTAL RADIAL FRACTURE;  Surgeon: Charlotte Crumb, MD;  Location: [facility];  Service: Orthopedics;  Laterality: Left;  . Carpal tunnel release Left 03/22/2015    Procedure: LEFT CARPAL TUNNEL RELEASE;  Surgeon: Charlotte Crumb, MD;  Location: [facility];  Service: Orthopedics;  Laterality: Left;   No family history on file. Social History  Substance Use Topics  . Smoking status: Never Smoker   . Smokeless tobacco: Never Used  . Alcohol Use: No   OB History    No data available     Review of Systems  All other systems reviewed and are negative.     Allergies  Penicillins  Home Medications   Prior to Admission medications   Not on File   BP 138/98 mmHg  Pulse 72  Temp(Src) 98.1 F (36.7 C) (Oral)  Resp 16  Ht 5\' 9"  (1.753 m)  Wt 307 lb (139.254 kg)  BMI 45.32 kg/m2  SpO2 98%  LMP 12/02/2015 Physical Exam  Constitutional: She is oriented to person, place, and time. She appears well-developed and well-nourished.  HENT:  Head: Normocephalic and  atraumatic.  Eyes: Conjunctivae and EOM are normal. Pupils are equal, round, and reactive to light.  Neck: Normal range of motion. Neck supple.  Cardiovascular: Normal rate and regular rhythm.   Pulmonary/Chest: Effort normal and breath sounds normal.  Abdominal: Soft. Bowel sounds are normal.  Musculoskeletal: Normal range of motion.  Neurological: She is alert and oriented to person, place, and time.  Skin: Skin is warm and dry.  5 x 5 cm freely movable mass on left anterior chest wall  Psychiatric: She has a normal mood and affect. Her behavior is normal.  Nursing note and vitals reviewed.   ED Course  Procedures (including critical care time) Labs Review Labs Reviewed  CBC WITH DIFFERENTIAL/PLATELET - Abnormal; Notable for the following:    WBC 11.6 (*)    Hemoglobin 9.8 (*)    HCT 30.8 (*)    MCV 69.8 (*)    MCH 22.2 (*)    RDW 18.1 (*)    Platelets 454 (*)    All other components within normal limits  BASIC METABOLIC PANEL - Abnormal; Notable for the following:    Calcium 8.8 (*)    All other components within normal limits  I-STAT BETA HCG BLOOD, ED ([facility], [facility], [facility] ONLY)    Imaging Review Ct Chest W Contrast  12/07/2015  CLINICAL DATA:  Left chest mass since 2012, enlarging over the past year. EXAM: CT CHEST WITH CONTRAST TECHNIQUE: Multidetector CT imaging of the  chest was performed during intravenous contrast administration. CONTRAST:  26mL ISOVUE-300 IOPAMIDOL (ISOVUE-300) INJECTION 61% COMPARISON:  None. FINDINGS: THORACIC INLET/BODY WALL: Thinly encapsulated fatty mass left supraclavicular subcutaneous space measuring 5 x 5 x 9 cm in maximal dimension. There is no internal complexity to suggest a high-grade or atypical lipomatous lesion. No intramuscular component. MEDIASTINUM: Normal heart size. No pericardial effusion. No acute vascular abnormality. No adenopathy. LUNG WINDOWS: Patchy fairly symmetric subpleural opacity with architectural distortion in the upper lobes is  likely scarring. No fibrotic features, cysts, or suspicious nodules. UPPER ABDOMEN: No acute findings. OSSEOUS: No acute fracture.  No suspicious lytic or blastic lesions. IMPRESSION: 5 x 5 x 9 cm lipoma in the subcutaneous left supraclavicular fossa. Electronically Signed   By: Monte Fantasia M.D.   On: 12/07/2015 15:06   I have personally reviewed and evaluated these images and lab results as part of my medical decision-making.   EKG Interpretation None      MDM   Final diagnoses:  Lipoma  Anemia, unspecified anemia type    History and physical consistent with lipoma. CT scan confirms same. Referral to general surgery. Discussed with patient and her family    Nat Christen, MD 12/07/15 [phone]

## 2016-05-24 ENCOUNTER — Ambulatory Visit: Payer: Self-pay | Admitting: General Surgery

## 2016-07-01 ENCOUNTER — Encounter (HOSPITAL_BASED_OUTPATIENT_CLINIC_OR_DEPARTMENT_OTHER): Payer: Self-pay | Admitting: *Deleted

## 2016-07-01 NOTE — Progress Notes (Signed)
NPO AFTER MN.  ARRIVE AT 0730.  NEEDS CBC W/ DIFF AND BMET.  WILL DO HIBICLENS SHOWER HS BEFORE AND AM DOS.

## 2016-07-04 ENCOUNTER — Encounter (HOSPITAL_BASED_OUTPATIENT_CLINIC_OR_DEPARTMENT_OTHER): Payer: Self-pay | Admitting: *Deleted

## 2016-07-04 ENCOUNTER — Encounter (HOSPITAL_BASED_OUTPATIENT_CLINIC_OR_DEPARTMENT_OTHER)
Admission: RE | Disposition: A | Discharge status: Home / Self Care | Payer: Self-pay | Source: Ambulatory Visit | Attending: General Surgery

## 2016-07-04 ENCOUNTER — Ambulatory Visit (HOSPITAL_BASED_OUTPATIENT_CLINIC_OR_DEPARTMENT_OTHER): Payer: Medicaid Other | Admitting: Anesthesiology

## 2016-07-04 ENCOUNTER — Ambulatory Visit (HOSPITAL_BASED_OUTPATIENT_CLINIC_OR_DEPARTMENT_OTHER)
Admission: RE | Admit: 2016-07-04 | Discharge: 2016-07-04 | Disposition: A | Discharge status: Home / Self Care | Payer: Medicaid Other | Source: Ambulatory Visit | Attending: General Surgery | Admitting: General Surgery

## 2016-07-04 DIAGNOSIS — Z880 Allergy status to penicillin: Secondary | ICD-10-CM | POA: Insufficient documentation

## 2016-07-04 DIAGNOSIS — Z6841 Body Mass Index (BMI) 40.0 and over, adult: Secondary | ICD-10-CM | POA: Insufficient documentation

## 2016-07-04 DIAGNOSIS — Z87891 Personal history of nicotine dependence: Secondary | ICD-10-CM | POA: Diagnosis not present

## 2016-07-04 DIAGNOSIS — Z91040 Latex allergy status: Secondary | ICD-10-CM | POA: Diagnosis not present

## 2016-07-04 DIAGNOSIS — D171 Benign lipomatous neoplasm of skin and subcutaneous tissue of trunk: Secondary | ICD-10-CM | POA: Insufficient documentation

## 2016-07-04 DIAGNOSIS — R222 Localized swelling, mass and lump, trunk: Secondary | ICD-10-CM | POA: Diagnosis present

## 2016-07-04 HISTORY — PX: LIPOMA EXCISION: SHX5283

## 2016-07-04 HISTORY — DX: Presence of spectacles and contact lenses: Z97.3

## 2016-07-04 HISTORY — DX: Anemia, unspecified: D64.9

## 2016-07-04 HISTORY — DX: Benign lipomatous neoplasm of skin and subcutaneous tissue of trunk: D17.1

## 2016-07-04 LAB — CBC WITH DIFFERENTIAL/PLATELET
BASOS ABS: 0.1 10*3/uL (ref 0.0–0.1)
Basophils Relative: 1 %
EOS ABS: 0.1 10*3/uL (ref 0.0–0.7)
Eosinophils Relative: 1 %
HCT: 30.2 % — ABNORMAL LOW (ref 36.0–46.0)
Hemoglobin: 9.3 g/dL — ABNORMAL LOW (ref 12.0–15.0)
LYMPHS ABS: 2.9 10*3/uL (ref 0.7–4.0)
Lymphocytes Relative: 29 %
MCH: 20.7 pg — ABNORMAL LOW (ref 26.0–34.0)
MCHC: 30.8 g/dL (ref 30.0–36.0)
MCV: 67.1 fL — ABNORMAL LOW (ref 78.0–100.0)
Monocytes Absolute: 0.9 10*3/uL (ref 0.1–1.0)
Monocytes Relative: 9 %
NEUTROS ABS: 5.9 10*3/uL (ref 1.7–7.7)
Neutrophils Relative %: 60 %
Platelets: 429 10*3/uL — ABNORMAL HIGH (ref 150–400)
RBC: 4.5 MIL/uL (ref 3.87–5.11)
RDW: 18.8 % — AB (ref 11.5–15.5)
WBC: 9.9 10*3/uL (ref 4.0–10.5)

## 2016-07-04 LAB — BASIC METABOLIC PANEL
ANION GAP: 7 (ref 5–15)
BUN: 12 mg/dL (ref 6–20)
CALCIUM: 8.6 mg/dL — AB (ref 8.9–10.3)
CO2: 24 mmol/L (ref 22–32)
Chloride: 107 mmol/L (ref 101–111)
Creatinine, Ser: 0.89 mg/dL (ref 0.44–1.00)
GFR calc Af Amer: >60 mL/min (ref >60)
GLUCOSE: 86 mg/dL (ref 65–99)
Potassium: 3.8 mmol/L (ref 3.5–5.1)
Sodium: 138 mmol/L (ref 135–145)

## 2016-07-04 LAB — POCT PREGNANCY, URINE: Preg Test, Ur: NEGATIVE

## 2016-07-04 SURGERY — EXCISION LIPOMA
Anesthesia: General | Site: Chest | Laterality: Left

## 2016-07-04 MED ORDER — IBUPROFEN 800 MG PO TABS: 800.0000 mg | ORAL_TABLET | Freq: Three times a day (TID) | ORAL | 0 refills | Status: AC | PRN

## 2016-07-04 MED ORDER — CHLORHEXIDINE GLUCONATE CLOTH 2 % EX PADS
6.0000 | MEDICATED_PAD | Freq: Once | CUTANEOUS | Status: DC
  Filled 2016-07-04: qty 6

## 2016-07-04 MED ORDER — DEXAMETHASONE SODIUM PHOSPHATE 4 MG/ML IJ SOLN: INTRAMUSCULAR | Status: DC | PRN

## 2016-07-04 MED ORDER — GABAPENTIN 300 MG PO CAPS
ORAL_CAPSULE | ORAL | Status: AC
  Filled 2016-07-04: qty 1

## 2016-07-04 MED ORDER — FENTANYL CITRATE (PF) 100 MCG/2ML IJ SOLN
INTRAMUSCULAR | Status: AC
  Filled 2016-07-04: qty 2

## 2016-07-04 MED ORDER — HYDROCODONE-ACETAMINOPHEN 5-325 MG PO TABS: 1.0000 | ORAL_TABLET | Freq: Four times a day (QID) | ORAL | 0 refills | Status: DC | PRN

## 2016-07-04 MED ORDER — LIDOCAINE HCL 4 % MT SOLN
OROMUCOSAL | Status: DC | PRN
  Administered 2016-07-04: 3 mL via TOPICAL

## 2016-07-04 MED ORDER — CIPROFLOXACIN IN D5W 400 MG/200ML IV SOLN
INTRAVENOUS | Status: AC
  Filled 2016-07-04: qty 200

## 2016-07-04 MED ORDER — ACETAMINOPHEN 500 MG PO TABS
1000.0000 mg | ORAL_TABLET | ORAL | Status: AC
  Administered 2016-07-04: 1000 mg via ORAL
  Filled 2016-07-04: qty 2

## 2016-07-04 MED ORDER — LIDOCAINE HCL (CARDIAC) 20 MG/ML IV SOLN
INTRAVENOUS | Status: DC | PRN
  Administered 2016-07-04: 100 mg via INTRAVENOUS

## 2016-07-04 MED ORDER — SUCCINYLCHOLINE CHLORIDE 200 MG/10ML IV SOSY
PREFILLED_SYRINGE | INTRAVENOUS | Status: DC | PRN
  Administered 2016-07-04: 40 mg via INTRAVENOUS
  Administered 2016-07-04: 120 mg via INTRAVENOUS

## 2016-07-04 MED ORDER — HYDROCODONE-ACETAMINOPHEN 5-325 MG PO TABS
ORAL_TABLET | ORAL | Status: AC
  Filled 2016-07-04: qty 1

## 2016-07-04 MED ORDER — PROPOFOL 10 MG/ML IV BOLUS
INTRAVENOUS | Status: AC
  Filled 2016-07-04: qty 20

## 2016-07-04 MED ORDER — ONDANSETRON HCL 4 MG/2ML IJ SOLN
INTRAMUSCULAR | Status: DC | PRN
  Administered 2016-07-04: 4 mg via INTRAVENOUS

## 2016-07-04 MED ORDER — PROPOFOL 10 MG/ML IV BOLUS
INTRAVENOUS | Status: DC | PRN
Start: 2016-07-04 — End: 2016-07-04
  Administered 2016-07-04: 250 mg via INTRAVENOUS
  Administered 2016-07-04: 50 mg via INTRAVENOUS

## 2016-07-04 MED ORDER — CELECOXIB 400 MG PO CAPS
400.0000 mg | ORAL_CAPSULE | ORAL | Status: AC
  Administered 2016-07-04: 400 mg via ORAL
  Filled 2016-07-04: qty 1

## 2016-07-04 MED ORDER — DEXAMETHASONE SODIUM PHOSPHATE 10 MG/ML IJ SOLN
INTRAMUSCULAR | Status: AC
  Filled 2016-07-04: qty 1

## 2016-07-04 MED ORDER — PROMETHAZINE HCL 25 MG/ML IJ SOLN
6.2500 mg | INTRAMUSCULAR | Status: DC | PRN
  Filled 2016-07-04: qty 1

## 2016-07-04 MED ORDER — LIDOCAINE 2% (20 MG/ML) 5 ML SYRINGE
INTRAMUSCULAR | Status: AC
  Filled 2016-07-04: qty 5

## 2016-07-04 MED ORDER — FENTANYL CITRATE (PF) 100 MCG/2ML IJ SOLN
25.0000 ug | INTRAMUSCULAR | Status: DC | PRN
  Administered 2016-07-04: 25 ug via INTRAVENOUS
  Filled 2016-07-04: qty 1

## 2016-07-04 MED ORDER — HYDROCODONE-ACETAMINOPHEN 5-325 MG PO TABS
1.0000 | ORAL_TABLET | Freq: Four times a day (QID) | ORAL | Status: DC | PRN
  Administered 2016-07-04: 1 via ORAL
  Filled 2016-07-04: qty 2

## 2016-07-04 MED ORDER — ONDANSETRON HCL 4 MG/2ML IJ SOLN
INTRAMUSCULAR | Status: AC
  Filled 2016-07-04: qty 2

## 2016-07-04 MED ORDER — LIDOCAINE 2% (20 MG/ML) 5 ML SYRINGE
INTRAMUSCULAR | Status: AC
Start: 2016-07-04 — End: 2016-07-04
  Filled 2016-07-04: qty 5

## 2016-07-04 MED ORDER — DEXAMETHASONE SODIUM PHOSPHATE 4 MG/ML IJ SOLN
INTRAMUSCULAR | Status: DC | PRN
  Administered 2016-07-04: 10 mg via INTRAVENOUS

## 2016-07-04 MED ORDER — ACETAMINOPHEN 500 MG PO TABS
ORAL_TABLET | ORAL | Status: AC
  Filled 2016-07-04: qty 2

## 2016-07-04 MED ORDER — FENTANYL CITRATE (PF) 100 MCG/2ML IJ SOLN
INTRAMUSCULAR | Status: DC | PRN
  Administered 2016-07-04 (×3): 50 ug via INTRAVENOUS

## 2016-07-04 MED ORDER — MIDAZOLAM HCL 2 MG/2ML IJ SOLN
INTRAMUSCULAR | Status: AC
  Filled 2016-07-04: qty 2

## 2016-07-04 MED ORDER — LACTATED RINGERS IV SOLN
INTRAVENOUS | Status: DC
  Administered 2016-07-04: 08:00:00 via INTRAVENOUS
  Filled 2016-07-04: qty 1000

## 2016-07-04 MED ORDER — MIDAZOLAM HCL 5 MG/5ML IJ SOLN
INTRAMUSCULAR | Status: DC | PRN
  Administered 2016-07-04: 2 mg via INTRAVENOUS

## 2016-07-04 MED ORDER — CELECOXIB 200 MG PO CAPS
ORAL_CAPSULE | ORAL | Status: AC
  Filled 2016-07-04: qty 2

## 2016-07-04 MED ORDER — GABAPENTIN 300 MG PO CAPS
300.0000 mg | ORAL_CAPSULE | ORAL | Status: AC
  Administered 2016-07-04: 300 mg via ORAL
  Filled 2016-07-04: qty 1

## 2016-07-04 MED ORDER — CIPROFLOXACIN IN D5W 400 MG/200ML IV SOLN
400.0000 mg | INTRAVENOUS | Status: AC
  Administered 2016-07-04: 400 mg via INTRAVENOUS
  Filled 2016-07-04: qty 200

## 2016-07-04 MED ORDER — BUPIVACAINE-EPINEPHRINE 0.5% -1:200000 IJ SOLN
INTRAMUSCULAR | Status: DC | PRN
  Administered 2016-07-04 (×2): 15 mL

## 2016-07-04 MED ORDER — SUCCINYLCHOLINE CHLORIDE 20 MG/ML IJ SOLN
INTRAMUSCULAR | Status: AC
  Filled 2016-07-04: qty 1

## 2016-07-04 SURGICAL SUPPLY — 44 items
BLADE HEX COATED 2.75 (ELECTRODE) ×3 IMPLANT
BLADE SURG 15 STRL LF DISP TIS (BLADE) ×1 IMPLANT
BLADE SURG 15 STRL SS (BLADE) ×3
CHLORAPREP W/TINT 26ML (MISCELLANEOUS) ×3 IMPLANT
COVER BACK TABLE 60X90IN (DRAPES) ×3 IMPLANT
COVER MAYO STAND STRL (DRAPES) IMPLANT
DRAIN PENROSE 18X1/2 LTX STRL (DRAIN) IMPLANT
DRAIN PENROSE 18X1/4 LTX STRL (WOUND CARE) IMPLANT
DRAPE LAPAROTOMY 100X72 PEDS (DRAPES) ×3 IMPLANT
DRAPE UTILITY XL STRL (DRAPES) ×3 IMPLANT
DRSG TEGADERM 4X4.75 (GAUZE/BANDAGES/DRESSINGS) IMPLANT
ELECT REM PT RETURN 9FT ADLT (ELECTROSURGICAL) ×3
ELECTRODE REM PT RTRN 9FT ADLT (ELECTROSURGICAL) ×1 IMPLANT
GLOVE BIOGEL PI IND STRL 6.5 (GLOVE) IMPLANT
GLOVE BIOGEL PI IND STRL 7.0 (GLOVE) ×1 IMPLANT
GLOVE BIOGEL PI IND STRL 7.5 (GLOVE) IMPLANT
GLOVE BIOGEL PI INDICATOR 6.5 (GLOVE) ×2
GLOVE BIOGEL PI INDICATOR 7.0 (GLOVE) ×2
GLOVE BIOGEL PI INDICATOR 7.5 (GLOVE) ×2
GLOVE SURG SS PI 6.5 STRL IVOR (GLOVE) ×2 IMPLANT
GLOVE SURG SS PI 7.0 STRL IVOR (GLOVE) ×3 IMPLANT
GOWN STRL REUS W/ TWL LRG LVL3 (GOWN DISPOSABLE) ×1 IMPLANT
GOWN STRL REUS W/TWL LRG LVL3 (GOWN DISPOSABLE) ×6
KIT ROOM TURNOVER WOR (KITS) ×3 IMPLANT
LIQUID BAND (GAUZE/BANDAGES/DRESSINGS) ×2 IMPLANT
NDL HYPO 25X1 1.5 SAFETY (NEEDLE) ×1 IMPLANT
NEEDLE HYPO 25X1 1.5 SAFETY (NEEDLE) ×3 IMPLANT
NS IRRIG 500ML POUR BTL (IV SOLUTION) ×2 IMPLANT
PACK BASIN DAY SURGERY FS (CUSTOM PROCEDURE TRAY) ×3 IMPLANT
PENCIL BUTTON HOLSTER BLD 10FT (ELECTRODE) ×3 IMPLANT
SPONGE GAUZE 4X4 12PLY STER LF (GAUZE/BANDAGES/DRESSINGS) IMPLANT
SPONGE LAP 4X18 X RAY DECT (DISPOSABLE) IMPLANT
SUT MNCRL AB 4-0 PS2 18 (SUTURE) ×2 IMPLANT
SUT SILK 3 0 TIES 17X18 (SUTURE)
SUT SILK 3-0 18XBRD TIE BLK (SUTURE) IMPLANT
SUT VIC AB 2-0 SH 27 (SUTURE)
SUT VIC AB 2-0 SH 27XBRD (SUTURE) IMPLANT
SUT VIC AB 3-0 SH 27 (SUTURE) ×3
SUT VIC AB 3-0 SH 27X BRD (SUTURE) IMPLANT
SYR CONTROL 10ML LL (SYRINGE) ×3 IMPLANT
TOWEL OR 17X24 6PK STRL BLUE (TOWEL DISPOSABLE) ×3 IMPLANT
TUBE CONNECTING 12'X1/4 (SUCTIONS) ×1
TUBE CONNECTING 12X1/4 (SUCTIONS) ×1 IMPLANT
YANKAUER SUCT BULB TIP NO VENT (SUCTIONS) ×2 IMPLANT

## 2016-07-04 NOTE — Transfer of Care (Signed)
Last Vitals:  Vitals:   07/04/16 0801  BP: 139/90  Resp: 16  Temp: 36.9 C    Last Pain:  Vitals:   07/04/16 0801  TempSrc: Oral      Patients Stated Pain Goal: 3 (07/04/16 0845)  Immediate Anesthesia Transfer of Care Note  Patient: Shelly Ramirez  Procedure(s) Performed: Procedure(s) (LRB): EXCISION OF LEFT CHEST WALL LIPOMA (Left)  Patient Location: PACU  Anesthesia Type: General  Level of Consciousness: awake, alert  and oriented  Airway & Oxygen Therapy: Patient Spontanous Breathing and Patient connected to nasal cannula oxygen  Post-op Assessment: Report given to PACU RN and Post -op Vital signs reviewed and stable  Post vital signs: Reviewed and stable  Complications: No apparent anesthesia complications

## 2016-07-04 NOTE — Anesthesia Preprocedure Evaluation (Addendum)
Anesthesia Evaluation  Patient identified by MRN, date of birth, ID band Patient awake    Reviewed: Allergy & Precautions, NPO status , Patient's Chart, lab work & pertinent test results  Airway Mallampati: II  TM Distance: >3 FB Neck ROM: Full    Dental  (+) Teeth Intact, Dental Advisory Given   Pulmonary neg pulmonary ROS, former smoker,    Pulmonary exam normal breath sounds clear to auscultation       Cardiovascular Exercise Tolerance: Good negative cardio ROS Normal cardiovascular exam Rhythm:Regular Rate:Normal     Neuro/Psych negative neurological ROS  negative psych ROS   GI/Hepatic negative GI ROS, Neg liver ROS,   Endo/Other  Morbid obesity  Renal/GU negative Renal ROS     Musculoskeletal negative musculoskeletal ROS (+)   Abdominal   Peds  Hematology negative hematology ROS (+)   Anesthesia Other Findings Day of surgery medications reviewed with the patient.  Left chest wall lipoma   Reproductive/Obstetrics                             Anesthesia Physical Anesthesia Plan  ASA: III  Anesthesia Plan: General   Post-op Pain Management:    Induction: Intravenous  Airway Management Planned: Oral ETT  Additional Equipment:   Intra-op Plan:   Post-operative Plan: Extubation in OR  Informed Consent: I have reviewed the patients History and Physical, chart, labs and discussed the procedure including the risks, benefits and alternatives for the proposed anesthesia with the patient or authorized representative who has indicated his/her understanding and acceptance.   Dental advisory given  Plan Discussed with: CRNA  Anesthesia Plan Comments: (Risks/benefits of general anesthesia discussed with patient including risk of damage to teeth, lips, gum, and tongue, nausea/vomiting, allergic reactions to medications, and the possibility of heart attack, stroke and death.  All  patient questions answered.  Patient wishes to proceed.)       Anesthesia Quick Evaluation

## 2016-07-04 NOTE — Anesthesia Postprocedure Evaluation (Signed)
Anesthesia Post Note  Patient: [name]  Procedure(s) Performed: Procedure(s) (LRB): EXCISION OF LEFT CHEST WALL LIPOMA (Left)  Patient location during evaluation: PACU Anesthesia Type: General Level of consciousness: awake and alert Pain management: pain level controlled Vital Signs Assessment: post-procedure vital signs reviewed and stable Respiratory status: spontaneous breathing, nonlabored ventilation, respiratory function stable and patient connected to nasal cannula oxygen Cardiovascular status: blood pressure returned to baseline and stable Postop Assessment: no signs of nausea or vomiting Anesthetic complications: no    Last Vitals:  Vitals:   07/04/16 0801 07/04/16 1105  BP: 139/90   Pulse:  (P) 84  Resp: 16 (P) 20  Temp: 36.9 C (P) 36.8 C    Last Pain:  Vitals:   07/04/16 0801  TempSrc: Oral                 Catalina Gravel

## 2016-07-04 NOTE — Op Note (Signed)
Preoperative diagnosis: chest mass  Postoperative diagnosis: same   Procedure: excision of 14cm left chest   Surgeon: Gurney Maxin, M.D.  Asst: none  Anesthesia: gen  Indications for procedure: Shelly Ramirez is a 40 y.o. year old female with symptoms of chest wall mass enlarging over years.  Description of procedure: The patient was brought into the operative suite. Anesthesia was administered with General endotracheal anesthesia. WHO checklist was applied. The patient was then placed in supine position. The area was prepped and draped in the usual sterile fashion.  The area was instilled with marcaine. A vertical incision was made directly over the mass. The deep dermal area was incised and blunt dissection was used to free the mass from the surrounding tissue. Cautery was used to remove a few fibrinous bands. The mass was removed in its entirety. The area was irrigated. Hemostasis was applied with cautery. 3-0 interrupted sutures were used to appose the deep tissues and then a 4-0 monocryl subcuticular suture was used to close the skin. Dermabond was put in place for dressing.  Findings: fatty mass 14 x 8 cm  Specimen: fatty mass  Implant: none   Blood loss: <40ml  Local anesthesia: 51ml [id number] marcaine  Complications: none  Gurney Maxin, M.D. General, Bariatric, & Minimally Invasive Surgery [facility]

## 2016-07-04 NOTE — H&P (Signed)
Shelly Ramirez is an 40 y.o. female.    Chief Complaint: left chest wall mass  HPI: 40 yo female with symptomatic left chest wall mass that has been growing over the last years. It has never drained or gotten red.  Past Medical History:  Diagnosis Date  . Borderline anemia   . Lipoma of chest wall    LEFT SIDE  . Wears glasses     Past Surgical History:  Procedure Laterality Date  . CARPAL TUNNEL RELEASE Left 03/22/2015   Procedure: LEFT CARPAL TUNNEL RELEASE;  Surgeon: Charlotte Crumb, MD;  Location: [place];  Service: Orthopedics;  Laterality: Left;  . CESAREAN SECTION  x3  last one 2000   w/ last one Bilateral Tubal Ligation  . OPEN REDUCTION INTERNAL FIXATION (ORIF) DISTAL RADIAL FRACTURE Left 03/22/2015   Procedure: OPEN REDUCTION INTERNAL FIXATION (ORIF) LEFT DISTAL RADIAL FRACTURE;  Surgeon: Charlotte Crumb, MD;  Location: [place];  Service: Orthopedics;  Laterality: Left;    History reviewed. No pertinent family history. Social History:  reports that she quit smoking about 2 years ago. Her smoking use included Cigarettes. She quit after 20.00 years of use. She has never used smokeless tobacco. She reports that she drinks alcohol. She reports that she does not use drugs.  Allergies:  Allergies  Allergen Reactions  . Latex Itching    Sensitive to latex condoms -ok when she wears gloves.  . Penicillins Itching    Yeast infection    No prescriptions prior to admission.    Results for orders placed or performed during the hospital encounter of 07/04/16 (from the past 48 hour(s))  Basic metabolic panel     Status: Abnormal   Collection Time: 07/04/16  8:15 AM  Result Value Ref Range   Sodium 138 135 - 145 mmol/L   Potassium 3.8 3.5 - 5.1 mmol/L   Chloride 107 101 - 111 mmol/L   CO2 24 22 - 32 mmol/L   Glucose, Bld 86 65 - 99 mg/dL   BUN 12 6 - 20 mg/dL   Creatinine, Ser 0.89 0.44 - 1.00 mg/dL   Calcium 8.6 (L) 8.9 - 10.3 mg/dL   GFR calc non Af Amer >60 >60 mL/min   GFR calc Af Amer >60 >60 mL/min    Comment: (NOTE) The eGFR has been calculated using the CKD EPI equation. This calculation has not been validated in all clinical situations. eGFR's persistently <60 mL/min signify possible Chronic Kidney Disease.    Anion gap 7 5 - 15  CBC WITH DIFFERENTIAL     Status: Abnormal   Collection Time: 07/04/16  8:15 AM  Result Value Ref Range   WBC 9.9 4.0 - 10.5 K/uL   RBC 4.50 3.87 - 5.11 MIL/uL   Hemoglobin 9.3 (L) 12.0 - 15.0 g/dL   HCT 30.2 (L) 36.0 - 46.0 %   MCV 67.1 (L) 78.0 - 100.0 fL   MCH 20.7 (L) 26.0 - 34.0 pg   MCHC 30.8 30.0 - 36.0 g/dL   RDW 18.8 (H) 11.5 - 15.5 %   Platelets 429 (H) 150 - 400 K/uL   Neutrophils Relative % 60 %   Lymphocytes Relative 29 %   Monocytes Relative 9 %   Eosinophils Relative 1 %   Basophils Relative 1 %   Neutro Abs 5.9 1.7 - 7.7 K/uL   Lymphs Abs 2.9 0.7 - 4.0 K/uL   Monocytes Absolute 0.9 0.1 - 1.0 K/uL   Eosinophils Absolute 0.1 0.0 -  0.7 K/uL   Basophils Absolute 0.1 0.0 - 0.1 K/uL   RBC Morphology TARGET CELLS   Pregnancy, urine POC     Status: None   Collection Time: 07/04/16  8:58 AM  Result Value Ref Range   Preg Test, Ur NEGATIVE NEGATIVE    Comment:        THE SENSITIVITY OF THIS METHODOLOGY IS >24 mIU/mL    No results found.  Review of Systems  Constitutional: Negative for chills and fever.  HENT: Negative for hearing loss.   Eyes: Negative for blurred vision and double vision.  Respiratory: Negative for cough and hemoptysis.   Cardiovascular: Negative for chest pain and palpitations.  Gastrointestinal: Negative for abdominal pain, nausea and vomiting.  Genitourinary: Negative for dysuria and urgency.  Musculoskeletal: Negative for myalgias and neck pain.  Skin: Negative for itching and rash.  Neurological: Negative for dizziness, tingling and headaches.  Endo/Heme/Allergies: Does not bruise/bleed easily.  Psychiatric/Behavioral: Negative  for depression and suicidal ideas.    Blood pressure 139/90, temperature 98.4 F (36.9 C), temperature source Oral, resp. rate 16, height 5' 10"  (1.778 m), weight (!) 143 kg (315 lb 5 oz), last menstrual period 06/17/2016, SpO2 100 %. Physical Exam  Vitals reviewed. Constitutional: She is oriented to person, place, and time. She appears well-developed and well-nourished.  HENT:  Head: Normocephalic and atraumatic.  Eyes: Conjunctivae and EOM are normal. Pupils are equal, round, and reactive to light.  Neck: Normal range of motion. Neck supple.  Cardiovascular: Normal rate and regular rhythm.   Respiratory: Effort normal and breath sounds normal.  GI: Soft. Bowel sounds are normal. She exhibits no distension. There is no tenderness.  Musculoskeletal: Normal range of motion.  Neurological: She is alert and oriented to person, place, and time.  Skin: Skin is warm and dry.  Psychiatric: She has a normal mood and affect. Her behavior is normal.     Assessment/Plan 40 yo female with enlarging left chest wall mass -excision  Mickeal Skinner, MD 07/04/2016, 9:39 AM

## 2016-07-04 NOTE — Anesthesia Procedure Notes (Signed)
Procedure Name: Intubation Date/Time: 07/04/2016 10:07 AM Performed by: Catalina Gravel Pre-anesthesia Checklist: Patient identified, Emergency Drugs available, Suction available and Patient being monitored Patient Re-evaluated:Patient Re-evaluated prior to inductionOxygen Delivery Method: Circle system utilized Preoxygenation: Pre-oxygenation with 100% oxygen Intubation Type: IV induction Ventilation: Mask ventilation without difficulty Laryngoscope Size: Mac and 4 Tube type: Oral Tube size: 7.0 mm Number of attempts: 1 Airway Equipment and Method: Stylet and LTA kit utilized Placement Confirmation: ETT inserted through vocal cords under direct vision,  positive ETCO2 and breath sounds checked- equal and bilateral Secured at: 20 cm Tube secured with: Tape Dental Injury: Teeth and Oropharynx as per pre-operative assessment

## 2016-07-04 NOTE — Discharge Instructions (Signed)

## 2016-07-05 ENCOUNTER — Encounter (HOSPITAL_BASED_OUTPATIENT_CLINIC_OR_DEPARTMENT_OTHER): Payer: Self-pay | Admitting: General Surgery

## 2016-07-29 ENCOUNTER — Encounter: Payer: Self-pay | Admitting: Women's Health

## 2016-07-31 ENCOUNTER — Encounter: Payer: Self-pay | Admitting: Obstetrics and Gynecology

## 2016-08-07 ENCOUNTER — Encounter: Payer: Self-pay | Admitting: Obstetrics and Gynecology

## 2016-08-23 ENCOUNTER — Encounter: Payer: Self-pay | Admitting: Adult Health

## 2016-08-23 ENCOUNTER — Ambulatory Visit (INDEPENDENT_AMBULATORY_CARE_PROVIDER_SITE_OTHER): Payer: Medicaid Other | Admitting: Adult Health

## 2016-08-23 VITALS — BP 114/66 | HR 70 | Ht 69.25 in | Wt 313.5 lb

## 2016-08-23 DIAGNOSIS — L298 Other pruritus: Secondary | ICD-10-CM

## 2016-08-23 DIAGNOSIS — Z113 Encounter for screening for infections with a predominantly sexual mode of transmission: Secondary | ICD-10-CM

## 2016-08-23 DIAGNOSIS — B379 Candidiasis, unspecified: Secondary | ICD-10-CM | POA: Diagnosis not present

## 2016-08-23 DIAGNOSIS — N898 Other specified noninflammatory disorders of vagina: Secondary | ICD-10-CM

## 2016-08-23 LAB — POCT WET PREP (WET MOUNT): WBC, Wet Prep HPF POC: POSITIVE

## 2016-08-23 MED ORDER — FLUCONAZOLE 150 MG PO TABS: ORAL_TABLET | ORAL | 1 refills | Status: AC

## 2016-08-23 NOTE — Patient Instructions (Signed)
Return in 3 weeks for pap and physical

## 2016-08-23 NOTE — Progress Notes (Signed)
Subjective:     Patient ID: Shelly Ramirez, female   DOB: 1976-07-15, 40 y.o.   MRN: [id number]  HPI Shelly Ramirez is a 40 year old black female, new to this practice, in complaining of vaginal discharge and itching, for about 3 weeks, had been on PCN since had tooth pulled.And has had new sex partner, she did use condom.   Review of Systems Vaginal discharge Vaginal itching Reviewed past medical,surgical, social and family history. Reviewed medications and allergies.     Objective:   Physical Exam BP 114/66 (BP Location: Left Arm, Patient Position: Sitting, Cuff Size: Large)   Pulse 70   Ht 5' 9.25" (1.759 m)   Wt (!) 313 lb 8 oz (142.2 kg)   LMP 08/02/2016 (Approximate)   BMI 45.96 kg/m PHQ 2 score 0. Skin warm and dry.Pelvic: external genitalia is red and swollen, vagina: white discharge with odor,urethra has no lesions or masses noted, cervix:smooth and bulbous, uterus: normal size, shape and contour, non tender, no masses felt, adnexa: no masses or tenderness noted. Bladder is non tender and no masses felt. Wet prep: + for yeast and +WBCs. GC/CHL obtained. Painted labia and vagina with gentian violet.   She declines blood work today for HIV and RPR.  Assessment:     1. Vaginal discharge   2. Itching in the vaginal area   3. Yeast infection   4. Screening examination for STD (sexually transmitted disease)       Plan:    GC/CHL sent Rx diflucan 150 mg #2 take 1 now and 1 in 3 days with 1 refills Return in 3 weeks for pap and physical

## 2016-08-25 LAB — GC/CHLAMYDIA PROBE AMP
CHLAMYDIA, DNA PROBE: NEGATIVE
NEISSERIA GONORRHOEAE BY PCR: NEGATIVE

## 2016-08-29 ENCOUNTER — Telehealth: Payer: Self-pay | Admitting: *Deleted

## 2016-08-29 NOTE — Telephone Encounter (Signed)
Pt informed of negative GC/CHL.  Pt verbalized understanding.

## 2016-08-30 ENCOUNTER — Encounter: Payer: Self-pay | Admitting: Obstetrics and Gynecology

## 2016-09-12 ENCOUNTER — Other Ambulatory Visit: Payer: Medicaid Other | Admitting: Adult Health

## 2016-09-23 ENCOUNTER — Other Ambulatory Visit: Payer: Medicaid Other | Admitting: Adult Health

## 2016-10-03 ENCOUNTER — Other Ambulatory Visit: Payer: Medicaid Other | Admitting: Adult Health

## 2017-08-14 ENCOUNTER — Emergency Department (HOSPITAL_COMMUNITY)
Admission: EM | Admit: 2017-08-14 | Discharge: 2017-08-14 | Disposition: A | Discharge status: Home / Self Care | Payer: Self-pay | Attending: Emergency Medicine | Admitting: Emergency Medicine

## 2017-08-14 ENCOUNTER — Encounter (HOSPITAL_COMMUNITY): Payer: Self-pay | Admitting: Emergency Medicine

## 2017-08-14 DIAGNOSIS — M5442 Lumbago with sciatica, left side: Secondary | ICD-10-CM | POA: Insufficient documentation

## 2017-08-14 DIAGNOSIS — F17210 Nicotine dependence, cigarettes, uncomplicated: Secondary | ICD-10-CM | POA: Insufficient documentation

## 2017-08-14 DIAGNOSIS — M545 Low back pain: Secondary | ICD-10-CM

## 2017-08-14 DIAGNOSIS — Z79899 Other long term (current) drug therapy: Secondary | ICD-10-CM | POA: Insufficient documentation

## 2017-08-14 LAB — URINALYSIS, ROUTINE W REFLEX MICROSCOPIC
Bilirubin Urine: NEGATIVE
GLUCOSE, UA: NEGATIVE mg/dL
HGB URINE DIPSTICK: NEGATIVE
KETONES UR: NEGATIVE mg/dL
Leukocytes, UA: NEGATIVE
Nitrite: NEGATIVE
PROTEIN: NEGATIVE mg/dL
Specific Gravity, Urine: 1.026 (ref 1.005–1.030)
pH: 6 (ref 5.0–8.0)

## 2017-08-14 LAB — PREGNANCY, URINE: PREG TEST UR: NEGATIVE

## 2017-08-14 MED ORDER — METHOCARBAMOL 500 MG PO TABS
500.0000 mg | ORAL_TABLET | Freq: Once | ORAL | Status: AC
  Administered 2017-08-14: 500 mg via ORAL
  Filled 2017-08-14: qty 1

## 2017-08-14 MED ORDER — KETOROLAC TROMETHAMINE 30 MG/ML IJ SOLN
30.0000 mg | Freq: Once | INTRAMUSCULAR | Status: AC
  Administered 2017-08-14: 30 mg via INTRAMUSCULAR
  Filled 2017-08-14: qty 1

## 2017-08-14 MED ORDER — PREDNISONE 10 MG PO TABS: 10.0000 mg | ORAL_TABLET | Freq: Every day | ORAL | 0 refills | Status: AC

## 2017-08-14 MED ORDER — METHOCARBAMOL 500 MG PO TABS: 500.0000 mg | ORAL_TABLET | Freq: Two times a day (BID) | ORAL | 0 refills | Status: AC

## 2017-08-14 MED ORDER — PREDNISONE 50 MG PO TABS
60.0000 mg | ORAL_TABLET | Freq: Once | ORAL | Status: AC
  Administered 2017-08-14: 60 mg via ORAL
  Filled 2017-08-14: qty 1

## 2017-08-14 NOTE — Discharge Instructions (Signed)
Please see the information and instructions below regarding your visit.  Your diagnoses today include:  1. Right low back pain, unspecified chronicity, with sciatica presence unspecified    About diagnosis. Most episodes of acute low back pain are self-limited. Your exam was reassuring today that the source of your pain is not affecting the spinal cord and nerves that originate in the spinal cord.   If you have a history of disc herniation or arthritis in your spine, the nerves exiting the spine on one side get inflamed. This can cause severe pain. We call this radiculopathy. We do not always know what causes the sudden inflammation.  Tests performed today include: See side panel of your discharge paperwork for testing performed today. Vital signs are listed at the bottom of these instructions.   Urine testing  Pregnancy testing  Medications prescribed:    Take any prescribed medications only as prescribed, and any over the counter medications only as directed on the packaging.  You are prescribed prednisone, a steroid in the Ed. This is a medication to help reduce inflammation in the spine.  Common side effects include upset stomach/nausea. You may take this medicine with food if this occurs. Other side effects include restlessness, difficulty sleeping, and increased sweating. Call your healthcare provider if these do not resolve after finishing the medication.  This medicine may increase your blood sugar so additional careful monitoring is needed of blood sugar if you have diabetes. Call your healthcare provider for any signs/symtpoms of high blood sugar such as confusion, feeling sleepy, more thirst, more hunger, passing urine more often, flushing, fast breathing, or breath that smells like fruit.  You are prescribed Robaxin, a muscle relaxant. Some common side effects of this medication include:  Feeling sleepy.  Dizziness. Take care upon going from a seated to a standing position.  Dry mouth.  Feeling tired or weak.  Hard stools (constipation).  Upset stomach. These are not all of the side effects that may occur. If you have questions about side effects, call your doctor. Call your primary care provider for medical advice about side effects.  This medication can be sedating. Only take this medication as needed. Please do not combine with alcohol. Do not drive or operate machinery while taking this medication.   This medication can interact with some other medications. Make sure to tell any provider you are taking this medication before they prescribe you a new medication.    Home care instructions:   Low back pain gets worse the longer you stay stationary. Please keep moving and walking as tolerated. There are exercises included in this packet to perform as tolerated for your low back pain.   Apply heat to the areas that are painful. Avoid twisting or bending your trunk to lift something. Do not lift anything above 25 lbs while recovering from this flare of low back pain.  I also recommend using Salon PAS patches for your pain.   Please follow any educational materials contained in this packet.   Follow-up instructions: Please follow-up with your primary care provider for further evaluation of your symptoms if they are not completely improved.   Please follow up with Dr. Aline Brochure in ortho for recurrent back pain.  Return instructions:  Please return to the Emergency Department if you experience worsening symptoms.  Please return for any fever or chills in the setting of your back pain, weakness in the muscles of the legs, numbness in your legs and feet that is new or  changing, numbness in the area where you wipe, retention of your urine, loss of bowel or bladder control, or problems with walking. Please return if you have any other emergent concerns.  Additional Information:   Your vital signs today were: BP (!) 141/72 (BP Location: Left Arm)    Pulse 93     Temp 98.1 F (36.7 C) (Oral)    Resp 18    Ht 5\' 10"  (1.778 m)    Wt (!) 145.2 kg (320 lb)    LMP 08/07/2017    SpO2 99%    BMI 45.92 kg/m  If your blood pressure (BP) was elevated on multiple readings during this visit above 130 for the top number or above 80 for the bottom number, please have this repeated by your primary care provider within one month. --------------  Thank you for allowing [place] to participate in your care today.

## 2017-08-14 NOTE — ED Triage Notes (Signed)
Pt states she began having R sided lower back pain 2 weeks ago and was seen at another hospital, given Rx which have not helped. States the pain is now running down her R leg. No OTC or Rx today, denies GU/GI problems. Ambulatory to triage.

## 2017-08-14 NOTE — ED Provider Notes (Signed)
[facility] EMERGENCY DEPARTMENT Provider Note   CSN: [id number] Arrival date & time: 08/14/17  1257     History   Chief Complaint Chief Complaint  Patient presents with  . Back Pain    HPI Shelly Ramirez is a 41 y.o. female.  HPI   Patient is a 41 year old female with no significant past medical history presenting for right-sided back and leg pain.  Patient reports this began approximately 2 weeks ago she presented to [facility].  Patient was given Toradol, and x-ray, and urine testing, all unremarkable.  Patient reports that is not improved and she began within the last week having pain radiating down her right hip and more on the posterior aspect of her leg.  It does not radiate down into the foot.  Patient has not had any weakness or numbness of the distal lower extremities.  No saddle anesthesia, loss of bowel or bladder control, history of IV drug use, history of cancer, or recent spinal procedures.  Patient does not take blood thinners.  Patient has not had any abdominal pain, dysuria, or vaginal bleeding outside of her period.  Past Medical History:  Diagnosis Date  . Borderline anemia   . Lipoma of chest wall    LEFT SIDE  . Wears glasses     There are no active problems to display for this patient.   Past Surgical History:  Procedure Laterality Date  . CARPAL TUNNEL RELEASE Left 03/22/2015   Procedure: LEFT CARPAL TUNNEL RELEASE;  Surgeon: Charlotte Crumb, MD;  Location: [place];  Service: Orthopedics;  Laterality: Left;  . CESAREAN SECTION  x3  last one 2000   w/ last one Bilateral Tubal Ligation  . LIPOMA EXCISION Left 07/04/2016   Procedure: EXCISION OF LEFT CHEST WALL LIPOMA;  Surgeon: Arta Bruce Kinsinger, MD;  Location: [place];  Service: General;  Laterality: Left;  . OPEN REDUCTION INTERNAL FIXATION (ORIF) DISTAL RADIAL FRACTURE Left 03/22/2015   Procedure: OPEN REDUCTION INTERNAL FIXATION (ORIF) LEFT DISTAL RADIAL  FRACTURE;  Surgeon: Charlotte Crumb, MD;  Location: [place];  Service: Orthopedics;  Laterality: Left;    OB History    Gravida Para Term Preterm AB Living   3 3 3     3    SAB TAB Ectopic Multiple Live Births           3       Home Medications    Prior to Admission medications   Medication Sig Start Date End Date Taking? Authorizing Provider  fluconazole (DIFLUCAN) 150 MG tablet Take 1 now and 1 in 3days 08/23/16   Derrek Monaco A, NP  ibuprofen (ADVIL,MOTRIN) 800 MG tablet Take 1 tablet (800 mg total) by mouth every 8 (eight) hours as needed. 07/04/16   Kinsinger, Arta Bruce, MD  methocarbamol (ROBAXIN) 500 MG tablet Take 1 tablet (500 mg total) by mouth 2 (two) times daily. 08/14/17   Langston Masker B, PA-C  predniSONE (DELTASONE) 10 MG tablet Take 1 tablet (10 mg total) by mouth daily. Take 5 tabs for 1 days, then 4 tabs for 1 days, then 3 tabs for 1 days, 2 tabs for 1 days, then 1 tab by mouth daily for 1 days 08/14/17   Albesa Seen, PA-C    Family History Family History  Problem Relation Age of Onset  . Cancer Maternal Grandmother   . Other Father        murdered  . Hypertension Mother  Social History Social History   Tobacco Use  . Smoking status: Former Smoker    Years: 20.00    Types: Cigarettes    Last attempt to quit: 07/01/2014    Years since quitting: 3.1  . Smokeless tobacco: Never Used  . Tobacco comment: hx occasional someday off and on smoker  Substance Use Topics  . Alcohol use: Yes    Comment: occasional  . Drug use: Yes    Types: Marijuana    Comment: not often     Allergies   Latex and Penicillins   Review of Systems Review of Systems  Constitutional: Negative for chills and fever.  Gastrointestinal: Negative for abdominal pain.  Genitourinary: Negative for dysuria.  Musculoskeletal: Positive for back pain. Negative for gait problem.  Neurological: Negative for weakness and numbness.     Physical  Exam Updated Vital Signs BP (!) 141/72 (BP Location: Left Arm)   Pulse 93   Temp 98.1 F (36.7 C) (Oral)   Resp 18   Ht 5\' 10"  (1.778 m)   Wt (!) 145.2 kg (320 lb)   LMP 08/07/2017   SpO2 99%   BMI 45.92 kg/m   Physical Exam  Constitutional: She appears well-developed and well-nourished. No distress.  Sitting comfortably in bed.  HENT:  Head: Normocephalic and atraumatic.  Eyes: Conjunctivae are normal. Right eye exhibits no discharge. Left eye exhibits no discharge.  EOMs normal to gross examination.  Neck: Normal range of motion.  Cardiovascular: Normal rate and regular rhythm.  Intact, 2+ DP pulses and equal bilaterally.  Pulmonary/Chest:  Normal respiratory effort. Patient converses comfortably. No audible wheeze or stridor.  Abdominal: She exhibits no distension. There is no tenderness.  Musculoskeletal: Normal range of motion.  Spine Exam: Inspection/Palpation: No midline tenderness to palpation of cervical, thoracic, or lumbar spine.  There is some paraspinal musculature discomfort on the right side.  Hip exam performed with nurse chaperone present.  There is no point tenderness over greater trochanter or trochanteric bursa.  No posterior hip pain. Strength: 5/5 throughout LE bilaterally (hip flexion/extension, adduction/abduction; knee flexion/extension; foot dorsiflexion/plantarflexion, inversion/eversion; great toe inversion) Sensation: Intact to light touch in proximal and distal LE bilaterally Reflexes: Difficult to illicit due to body habitus. No evidence of hyperreflexia.  Gait symmetric and with good coordination.  No evidence of foot drop.  Slightly antalgic favoring the right.  Neurological: She is alert.  Cranial nerves intact to gross observation. Patient moves extremities without difficulty.  Skin: Skin is warm and dry. She is not diaphoretic.  Psychiatric: She has a normal mood and affect. Her behavior is normal. Judgment and thought content normal.   Nursing note and vitals reviewed.    ED Treatments / Results  Labs (all labs ordered are listed, but only abnormal results are displayed) Labs Reviewed  PREGNANCY, URINE  URINALYSIS, ROUTINE W REFLEX MICROSCOPIC    EKG  EKG Interpretation None       Radiology No results found.  Procedures Procedures (including critical care time)  Medications Ordered in ED Medications  ketorolac (TORADOL) 30 MG/ML injection 30 mg (30 mg Intramuscular Given 08/14/17 1440)  methocarbamol (ROBAXIN) tablet 500 mg (500 mg Oral Given 08/14/17 1440)  predniSONE (DELTASONE) tablet 60 mg (60 mg Oral Given 08/14/17 1440)     Initial Impression / Assessment and Plan / ED Course  I have reviewed the triage vital signs and the nursing notes.  Pertinent labs & imaging results that were available during my care of the patient were  reviewed by me and considered in my medical decision making (see chart for details).     Final Clinical Impressions(s) / ED Diagnoses   Final diagnoses:  Right low back pain, unspecified chronicity, with sciatica presence unspecified   Patient presents with likely radicular symptoms that are radiating down the right leg.  Patient denies any concerning symptoms suggestive of cauda equina requiring urgent imaging at this time such as loss of sensation in the lower extremities, lower extremity weakness, loss of bowel or bladder control, saddle anesthesia, urinary retention, fever/chills, IVDU. Exam demonstrated no  weakness on exam today. No preceding injury or trauma to suggest acute fracture. Doubt pelvic or urinary pathology for patient's acute back pain, as patient denies urinary symptoms, has no evidence of infection on UA, has no CVA tenderness, history/pain not consistent with nephrolithiasis, has no vaginal discharge, and is not pregnant as evidenced by negative POC urine pregnancy test. Patient given strict return precautions for any symptoms indicating worsening  neurologic function in the lower extremities.  Recommend primary care and orthopedic follow-up.  Nursing notes reviewed. Vital signs reviewed. All questions answered by patient.   ED Discharge Orders        Ordered    predniSONE (DELTASONE) 10 MG tablet  Daily     08/14/17 1539    methocarbamol (ROBAXIN) 500 MG tablet  2 times daily     08/14/17 1539       Albesa Seen, [place] 08/14/17 [place], [place], DO 08/16/17 1530

## 2017-11-10 IMAGING — CT CT CHEST W/ CM
2 of 6 series · 15 of 36 positions shown, 19 images · IV contrast (Omnipaque 300)
Comparison: None.

CLINICAL DATA: Left chest mass since 5525, enlarging over the past
year.

EXAM:
CT CHEST WITH CONTRAST
TECHNIQUE: Multidetector CT imaging of the chest was performed during
intravenous contrast administration.
CONTRAST:  75mL FZPLCT-TWW IOPAMIDOL (FZPLCT-TWW) INJECTION 61%

[Series 2: chestroutine 2.0 b40f · axial · 0.89mm/px · z∈[+1014,+1308]mm · 14 of 165 slices shown, 18 images]
[im 9/165  mediastinal]
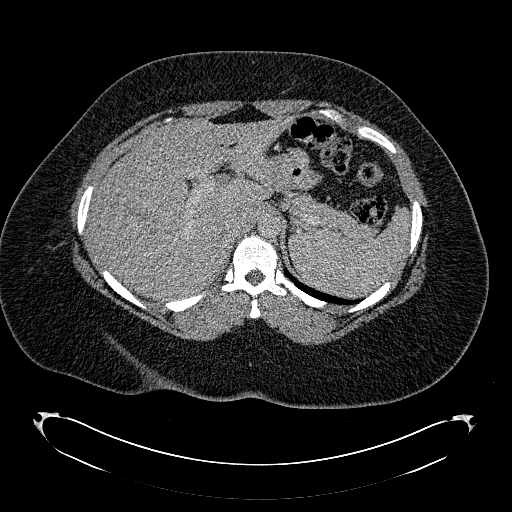
[im 9/165  lung]
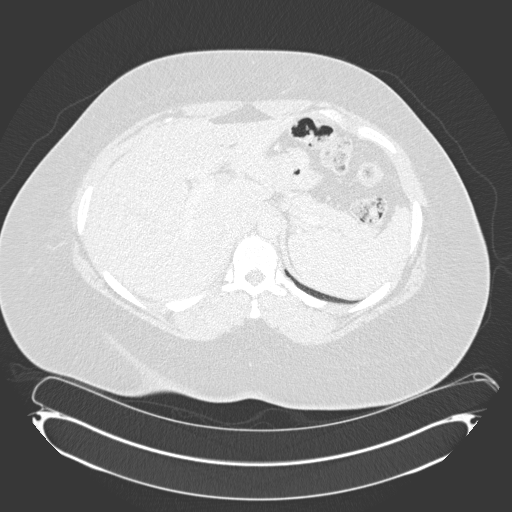
[im 18/165  lung]
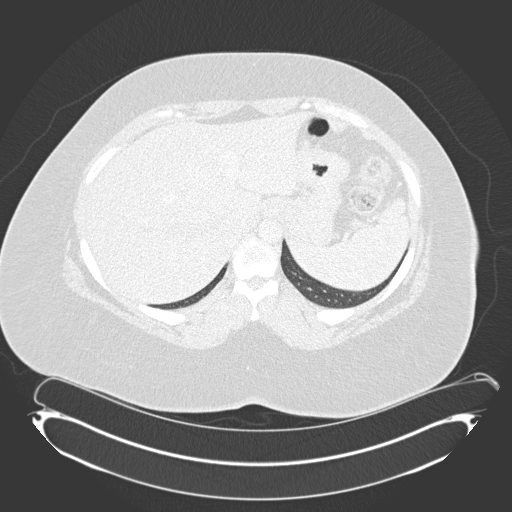
[im 35/165  lung]
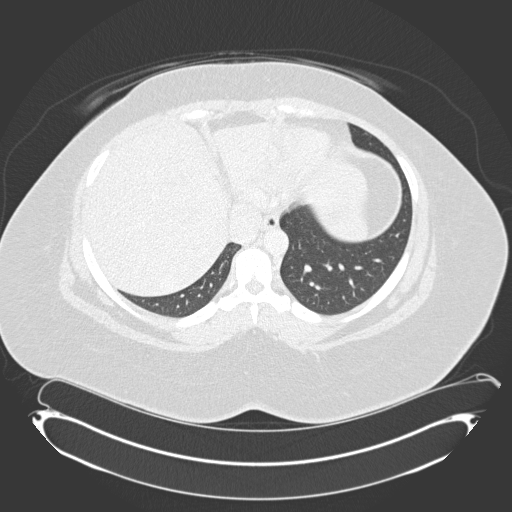
[im 44/165  lung]
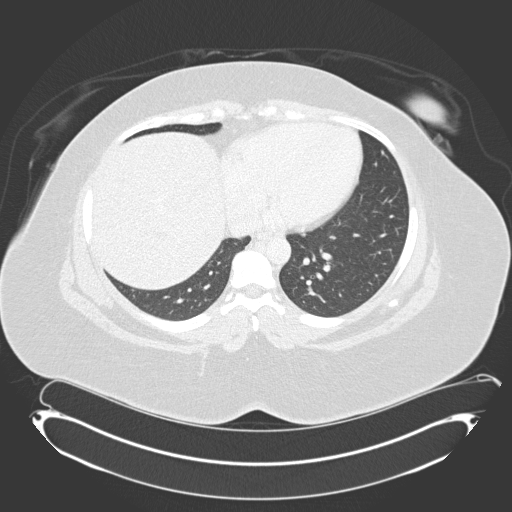
[im 52/165  mediastinal]
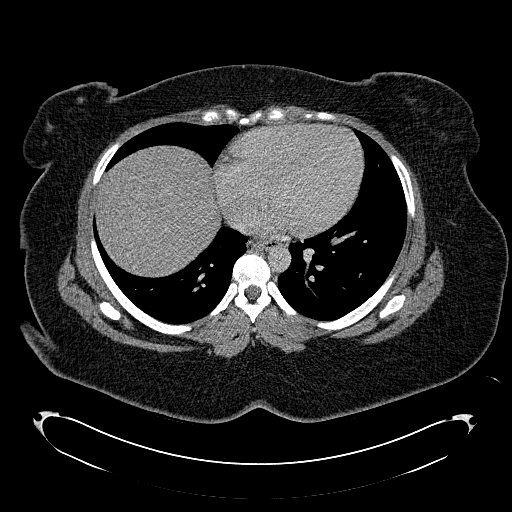
[im 52/165  lung]
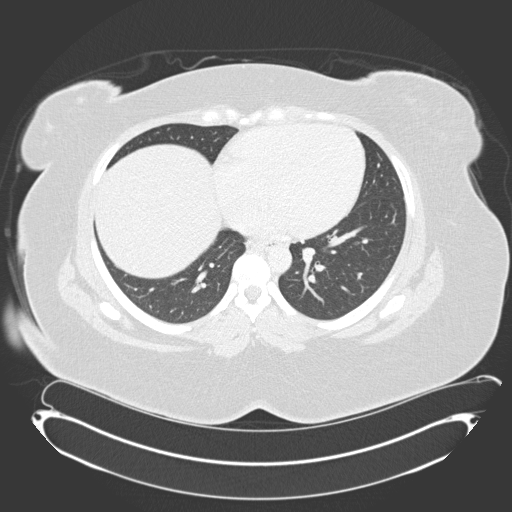
[im 70/165  lung]
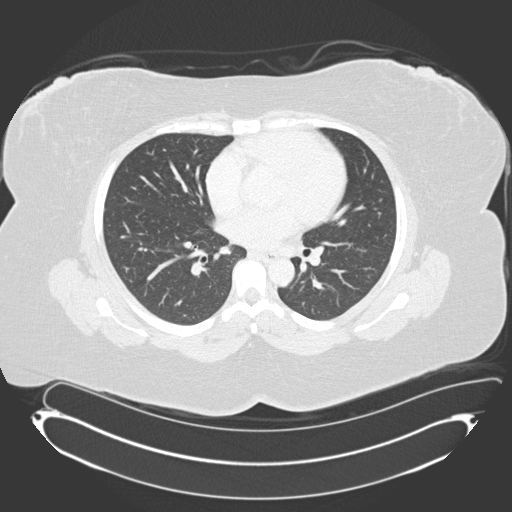
[im 78/165  lung]
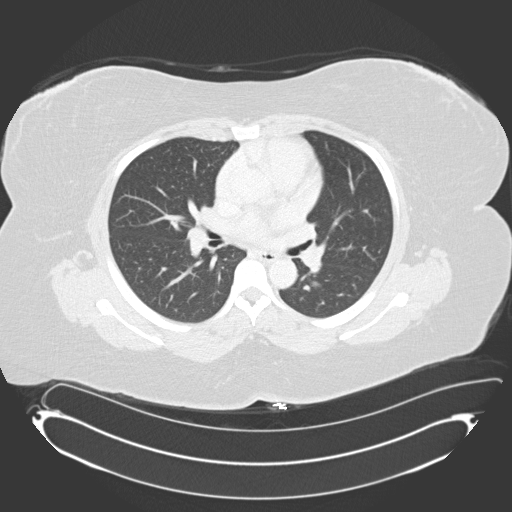
[im 87/165  lung]
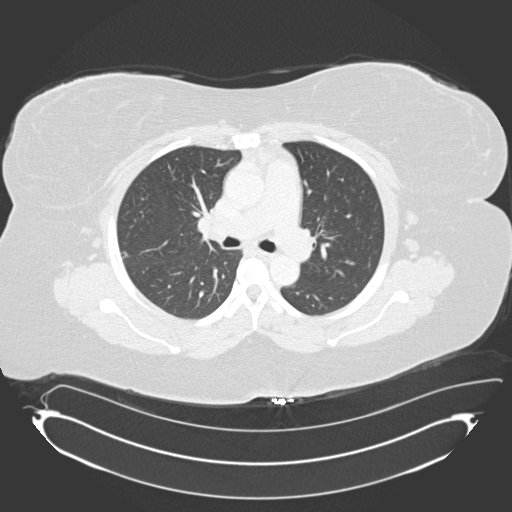
[im 95/165  mediastinal]
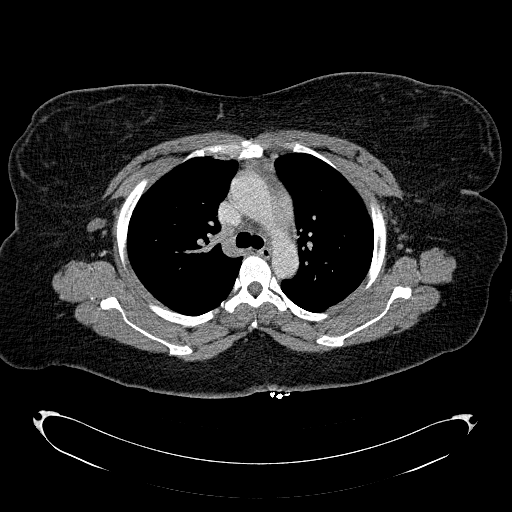
[im 95/165  lung]
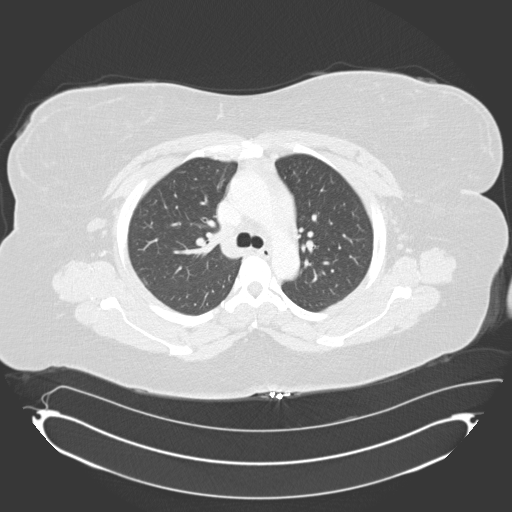
[im 113/165  lung]
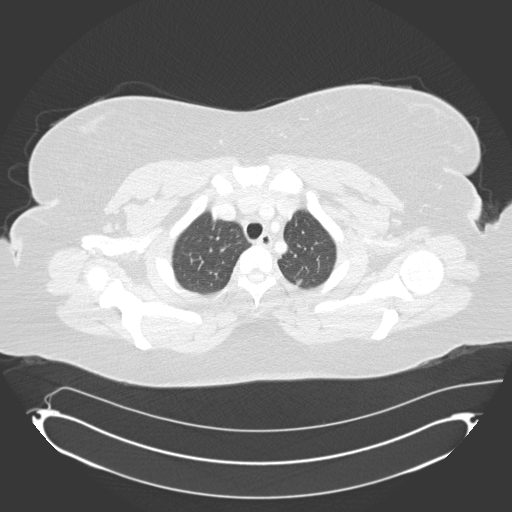
[im 121/165  lung]
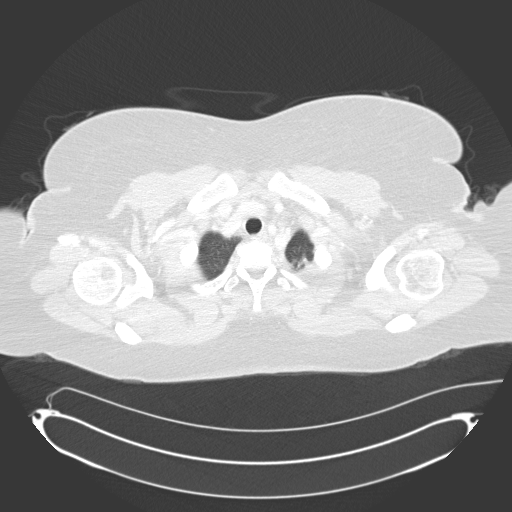
[im 130/165  lung]
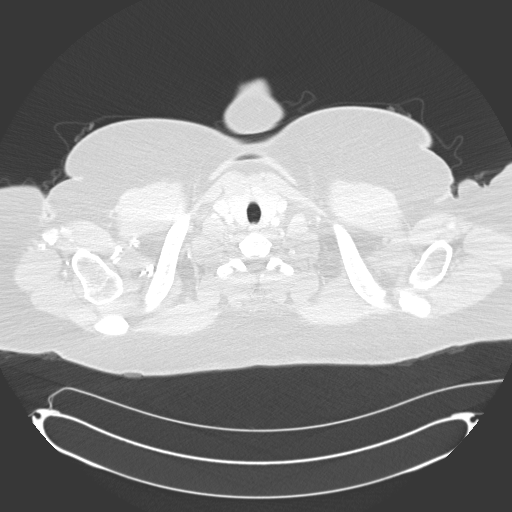
[im 147/165  mediastinal]
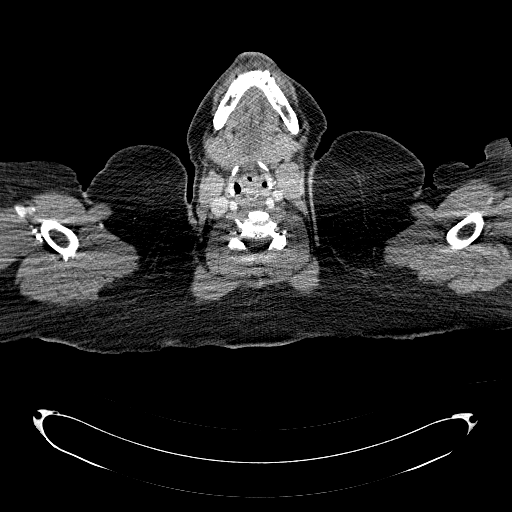
[im 147/165  lung]
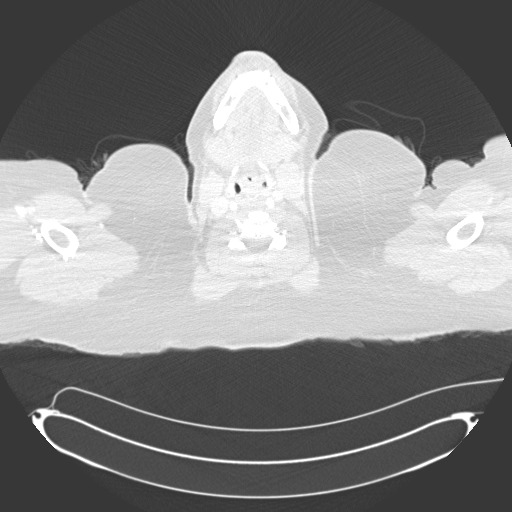
[im 156/165  lung]
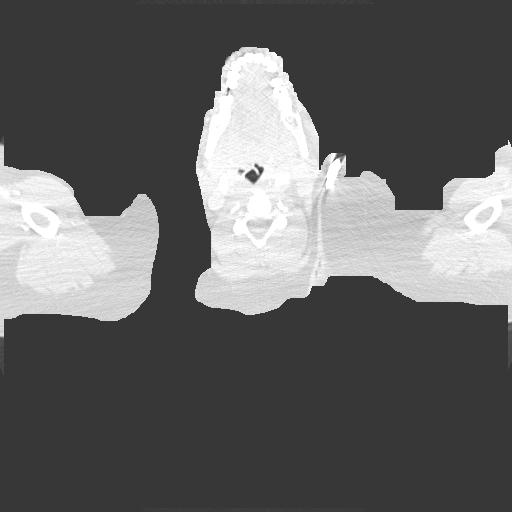

[Series 6: mpr coronal chest 3mm · coronal · 0.65mm/px · 1 of 154 slices shown]
[im 77/154  lung]
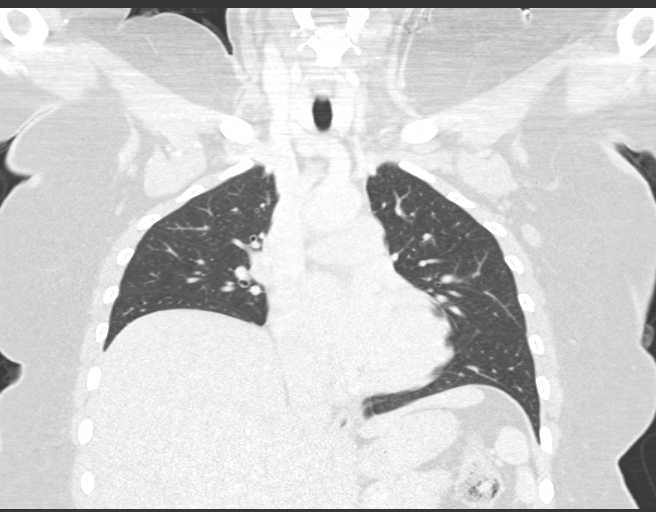

[15 of 36 positions shown; findings below may reference images not displayed]

FINDINGS: THORACIC INLET/BODY WALL:

Thinly encapsulated fatty mass left supraclavicular subcutaneous
space measuring 5 x 5 x 9 cm in maximal dimension. There is no
internal complexity to suggest a high-grade or atypical lipomatous
lesion. No intramuscular component.

MEDIASTINUM:

Normal heart size. No pericardial effusion. No acute vascular
abnormality. No adenopathy.

LUNG WINDOWS:

Patchy fairly symmetric subpleural opacity with architectural
distortion in the upper lobes is likely scarring. No fibrotic
features, cysts, or suspicious nodules.

UPPER ABDOMEN:

No acute findings.

OSSEOUS:

No acute fracture.  No suspicious lytic or blastic lesions.
IMPRESSION: 5 x 5 x 9 cm lipoma in the subcutaneous left supraclavicular fossa.

## 2019-08-30 ENCOUNTER — Ambulatory Visit: Payer: Self-pay | Attending: Internal Medicine

## 2019-08-30 ENCOUNTER — Other Ambulatory Visit: Payer: Self-pay

## 2019-08-30 DIAGNOSIS — Z20822 Contact with and (suspected) exposure to covid-19: Secondary | ICD-10-CM

## 2019-08-31 LAB — NOVEL CORONAVIRUS, NAA: SARS-CoV-2, NAA: NOT DETECTED

## 2019-09-01 ENCOUNTER — Telehealth: Payer: Self-pay | Admitting: Hematology

## 2019-09-01 NOTE — Telephone Encounter (Signed)
Pt is aware covid 19 test is neg on 09/01/2019 

## 2020-04-04 ENCOUNTER — Other Ambulatory Visit (HOSPITAL_COMMUNITY): Payer: Self-pay | Admitting: Physician Assistant

## 2020-04-04 DIAGNOSIS — Z1231 Encounter for screening mammogram for malignant neoplasm of breast: Secondary | ICD-10-CM

## 2020-04-12 ENCOUNTER — Ambulatory Visit (HOSPITAL_COMMUNITY): Payer: Self-pay

## 2022-06-24 ENCOUNTER — Encounter (HOSPITAL_COMMUNITY): Payer: Self-pay | Admitting: Emergency Medicine

## 2022-06-24 ENCOUNTER — Other Ambulatory Visit: Payer: Self-pay

## 2022-06-24 DIAGNOSIS — K0889 Other specified disorders of teeth and supporting structures: Secondary | ICD-10-CM | POA: Diagnosis not present

## 2022-06-24 DIAGNOSIS — Z5321 Procedure and treatment not carried out due to patient leaving prior to being seen by health care provider: Secondary | ICD-10-CM | POA: Diagnosis not present

## 2022-06-24 MED ORDER — IBUPROFEN 800 MG PO TABS
800.0000 mg | ORAL_TABLET | Freq: Once | ORAL | Status: AC
Start: 2022-06-24 — End: 2022-06-24
  Administered 2022-06-24: 800 mg via ORAL
  Filled 2022-06-24: qty 1

## 2022-06-24 NOTE — ED Triage Notes (Signed)
Pt c/o abscess to right lower tooth. States has had them before. Does not have dentist. Has been taking otc meds with no relief. Mild swelling noted to right lower jaw. Nad.

## 2022-06-25 ENCOUNTER — Emergency Department (HOSPITAL_COMMUNITY)
Admission: EM | Admit: 2022-06-25 | Discharge: 2022-06-25 | Discharge status: Against Medical Advice | Payer: 59 | Attending: Emergency Medicine | Admitting: Emergency Medicine

## 2022-06-25 HISTORY — DX: Periapical abscess without sinus: K04.7

## 2023-02-06 DIAGNOSIS — K069 Disorder of gingiva and edentulous alveolar ridge, unspecified: Secondary | ICD-10-CM | POA: Diagnosis not present

## 2023-02-06 DIAGNOSIS — M7989 Other specified soft tissue disorders: Secondary | ICD-10-CM | POA: Diagnosis not present

## 2023-02-06 DIAGNOSIS — K056 Periodontal disease, unspecified: Secondary | ICD-10-CM | POA: Diagnosis not present

## 2023-02-06 DIAGNOSIS — K0889 Other specified disorders of teeth and supporting structures: Secondary | ICD-10-CM | POA: Diagnosis not present

## 2023-02-06 DIAGNOSIS — R1012 Left upper quadrant pain: Secondary | ICD-10-CM | POA: Diagnosis not present

## 2024-05-04 ENCOUNTER — Other Ambulatory Visit: Payer: Self-pay | Admitting: Physician Assistant

## 2024-05-04 ENCOUNTER — Telehealth: Payer: Self-pay

## 2024-05-04 DIAGNOSIS — Z1231 Encounter for screening mammogram for malignant neoplasm of breast: Secondary | ICD-10-CM

## 2024-05-04 NOTE — Telephone Encounter (Signed)
 Telephoned patient at mobile number. Left a voice message with [facility] (scholars[facility]) contact information.

## 2024-05-19 ENCOUNTER — Ambulatory Visit (HOSPITAL_COMMUNITY)

## 2024-06-02 ENCOUNTER — Ambulatory Visit (HOSPITAL_COMMUNITY): Payer: Self-pay
